# Patient Record
Sex: Male | Born: 1945 | Race: White | Hispanic: No | Marital: Married | Smoking: Former smoker
Health system: Southern US, Community
[De-identification: ages and names within clinical notes are randomized; demographics above are authoritative.]

## PROBLEM LIST (undated history)

## (undated) DIAGNOSIS — L570 Actinic keratosis: Secondary | ICD-10-CM

## (undated) DIAGNOSIS — I10 Essential (primary) hypertension: Secondary | ICD-10-CM

## (undated) DIAGNOSIS — F419 Anxiety disorder, unspecified: Secondary | ICD-10-CM

## (undated) DIAGNOSIS — K5792 Diverticulitis of intestine, part unspecified, without perforation or abscess without bleeding: Secondary | ICD-10-CM

## (undated) DIAGNOSIS — N3001 Acute cystitis with hematuria: Secondary | ICD-10-CM

## (undated) DIAGNOSIS — N400 Enlarged prostate without lower urinary tract symptoms: Secondary | ICD-10-CM

## (undated) DIAGNOSIS — R339 Retention of urine, unspecified: Secondary | ICD-10-CM

## (undated) DIAGNOSIS — E7800 Pure hypercholesterolemia, unspecified: Secondary | ICD-10-CM

## (undated) DIAGNOSIS — M1990 Unspecified osteoarthritis, unspecified site: Secondary | ICD-10-CM

## (undated) HISTORY — DX: Pure hypercholesterolemia, unspecified: E78.00

## (undated) HISTORY — DX: Acute cystitis with hematuria: N30.01

## (undated) HISTORY — DX: Essential (primary) hypertension: I10

## (undated) HISTORY — DX: Benign prostatic hyperplasia without lower urinary tract symptoms: N40.0

## (undated) HISTORY — DX: Anxiety disorder, unspecified: F41.9

## (undated) HISTORY — DX: Actinic keratosis: L57.0

## (undated) HISTORY — DX: Retention of urine, unspecified: R33.9

---
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
## 1994-12-28 HISTORY — PX: RHINOPLASTY: SUR1284

## 2004-10-29 ENCOUNTER — Ambulatory Visit: Payer: Self-pay | Admitting: Unknown Physician Specialty

## 2007-03-14 ENCOUNTER — Ambulatory Visit: Payer: Self-pay | Admitting: Unknown Physician Specialty

## 2010-05-20 ENCOUNTER — Ambulatory Visit: Payer: Self-pay | Admitting: Unknown Physician Specialty

## 2010-12-28 DIAGNOSIS — K5792 Diverticulitis of intestine, part unspecified, without perforation or abscess without bleeding: Secondary | ICD-10-CM

## 2010-12-28 HISTORY — DX: Diverticulitis of intestine, part unspecified, without perforation or abscess without bleeding: K57.92

## 2011-07-24 ENCOUNTER — Ambulatory Visit: Payer: Self-pay | Admitting: Unknown Physician Specialty

## 2011-07-27 LAB — PATHOLOGY REPORT

## 2014-06-28 DIAGNOSIS — E785 Hyperlipidemia, unspecified: Secondary | ICD-10-CM | POA: Insufficient documentation

## 2014-06-28 DIAGNOSIS — M159 Polyosteoarthritis, unspecified: Secondary | ICD-10-CM | POA: Insufficient documentation

## 2014-06-28 DIAGNOSIS — I10 Essential (primary) hypertension: Secondary | ICD-10-CM | POA: Insufficient documentation

## 2014-12-28 HISTORY — PX: OTHER SURGICAL HISTORY: SHX169

## 2015-01-14 ENCOUNTER — Ambulatory Visit: Payer: Self-pay | Admitting: Surgery

## 2015-01-14 LAB — BASIC METABOLIC PANEL
ANION GAP: 6 — AB (ref 7–16)
BUN: 13 mg/dL (ref 7–18)
CHLORIDE: 102 mmol/L (ref 98–107)
CO2: 31 mmol/L (ref 21–32)
Calcium, Total: 9 mg/dL (ref 8.5–10.1)
Creatinine: 0.8 mg/dL (ref 0.60–1.30)
EGFR (African American): >60
EGFR (Non-African Amer.): >60
Glucose: 82 mg/dL (ref 65–99)
OSMOLALITY: 277 (ref 275–301)
Potassium: 4.2 mmol/L (ref 3.5–5.1)
Sodium: 139 mmol/L (ref 136–145)

## 2015-01-14 LAB — CBC WITH DIFFERENTIAL/PLATELET
Basophil #: 0 10*3/uL (ref 0.0–0.1)
Basophil %: 0.4 %
EOS PCT: 3.1 %
Eosinophil #: 0.1 10*3/uL (ref 0.0–0.7)
HCT: 44.9 % (ref 40.0–52.0)
HGB: 14.9 g/dL (ref 13.0–18.0)
LYMPHS ABS: 1.5 10*3/uL (ref 1.0–3.6)
Lymphocyte %: 35.7 %
MCH: 32.4 pg (ref 26.0–34.0)
MCHC: 33.3 g/dL (ref 32.0–36.0)
MCV: 97 fL (ref 80–100)
Monocyte #: 0.5 x10 3/mm (ref 0.2–1.0)
Monocyte %: 11.2 %
NEUTROS ABS: 2.1 10*3/uL (ref 1.4–6.5)
Neutrophil %: 49.6 %
Platelet: 183 10*3/uL (ref 150–440)
RBC: 4.61 10*6/uL (ref 4.40–5.90)
RDW: 13 % (ref 11.5–14.5)
WBC: 4.2 10*3/uL (ref 3.8–10.6)

## 2015-01-22 ENCOUNTER — Ambulatory Visit: Payer: Self-pay | Admitting: Surgery

## 2015-02-01 ENCOUNTER — Inpatient Hospital Stay: Payer: Self-pay | Admitting: Surgery

## 2015-02-01 LAB — CBC WITH DIFFERENTIAL/PLATELET
BASOS ABS: 0 10*3/uL (ref 0.0–0.1)
Basophil %: 0 %
EOS ABS: 0 10*3/uL (ref 0.0–0.7)
Eosinophil %: 0.1 %
HCT: 41 % (ref 40.0–52.0)
HGB: 14 g/dL (ref 13.0–18.0)
LYMPHS PCT: 8.6 %
Lymphocyte #: 1 10*3/uL (ref 1.0–3.6)
MCH: 32 pg (ref 26.0–34.0)
MCHC: 34.1 g/dL (ref 32.0–36.0)
MCV: 94 fL (ref 80–100)
MONOS PCT: 12 %
Monocyte #: 1.4 x10 3/mm — ABNORMAL HIGH (ref 0.2–1.0)
NEUTROS ABS: 9.4 10*3/uL — AB (ref 1.4–6.5)
NEUTROS PCT: 79.3 %
Platelet: 216 10*3/uL (ref 150–440)
RBC: 4.38 10*6/uL — ABNORMAL LOW (ref 4.40–5.90)
RDW: 12.9 % (ref 11.5–14.5)
WBC: 11.8 10*3/uL — AB (ref 3.8–10.6)

## 2015-02-01 LAB — BASIC METABOLIC PANEL
Anion Gap: 13 (ref 7–16)
BUN: 23 mg/dL — ABNORMAL HIGH (ref 7–18)
Calcium, Total: 9 mg/dL (ref 8.5–10.1)
Chloride: 88 mmol/L — ABNORMAL LOW (ref 98–107)
Co2: 25 mmol/L (ref 21–32)
Creatinine: 1.79 mg/dL — ABNORMAL HIGH (ref 0.60–1.30)
EGFR (Non-African Amer.): 40 — ABNORMAL LOW
GFR CALC AF AMER: 49 — AB
GLUCOSE: 100 mg/dL — AB (ref 65–99)
Osmolality: 257 (ref 275–301)
POTASSIUM: 3.6 mmol/L (ref 3.5–5.1)
Sodium: 126 mmol/L — ABNORMAL LOW (ref 136–145)

## 2015-02-02 LAB — CBC WITH DIFFERENTIAL/PLATELET
BASOS ABS: 0 10*3/uL (ref 0.0–0.1)
Basophil %: 0.2 %
EOS PCT: 1.1 %
Eosinophil #: 0.1 10*3/uL (ref 0.0–0.7)
HCT: 35.3 % — AB (ref 40.0–52.0)
HGB: 12 g/dL — ABNORMAL LOW (ref 13.0–18.0)
LYMPHS PCT: 25.9 %
Lymphocyte #: 1.4 10*3/uL (ref 1.0–3.6)
MCH: 31.7 pg (ref 26.0–34.0)
MCHC: 34 g/dL (ref 32.0–36.0)
MCV: 93 fL (ref 80–100)
MONOS PCT: 16.7 %
Monocyte #: 0.9 x10 3/mm (ref 0.2–1.0)
Neutrophil #: 3.1 10*3/uL (ref 1.4–6.5)
Neutrophil %: 56.1 %
PLATELETS: 173 10*3/uL (ref 150–440)
RBC: 3.78 10*6/uL — ABNORMAL LOW (ref 4.40–5.90)
RDW: 12.6 % (ref 11.5–14.5)
WBC: 5.6 10*3/uL (ref 3.8–10.6)

## 2015-02-02 LAB — BASIC METABOLIC PANEL
Anion Gap: 7 (ref 7–16)
BUN: 13 mg/dL (ref 7–18)
CHLORIDE: 100 mmol/L (ref 98–107)
Calcium, Total: 8.8 mg/dL (ref 8.5–10.1)
Co2: 31 mmol/L (ref 21–32)
Creatinine: 1 mg/dL (ref 0.60–1.30)
EGFR (African American): >60
EGFR (Non-African Amer.): >60
Glucose: 83 mg/dL (ref 65–99)
OSMOLALITY: 275 (ref 275–301)
POTASSIUM: 3.1 mmol/L — AB (ref 3.5–5.1)
Sodium: 138 mmol/L (ref 136–145)

## 2015-02-06 ENCOUNTER — Emergency Department: Payer: Self-pay | Admitting: Emergency Medicine

## 2015-04-28 NOTE — H&P (Signed)
PATIENT NAME:  Cole Holt, Cole Holt MR#:  [id number] DATE OF BIRTH:  01-24-1946  DATE OF ADMISSION:  01/22/2015   CHIEF COMPLAINT: Left inguinal hernia.    HISTORY OF PRESENT ILLNESS: This is a 69 year old male with a long history of a left inguinal hernia that is causing him pain and a bulge. The pain is minimal and occasional, located in the left side in the left hemiscrotum.  He is here for elective laparoscopic preperitoneal repair of a left inguinal hernia.   PAST MEDICAL HISTORY: Hypertension, hyperlipidemia.   PAST SURGICAL HISTORY: Appendectomy, nasal septum surgery, and right inguinal hernia repair with recurrence and repair.   MEDICATIONS: Lisinopril and Lipitor.   ALLERGIES: None.   FAMILY HISTORY: Noncontributory.   SOCIAL HISTORY: The patient is a former smoker, occasional drinks alcohol. He is retired.   REVIEW OF SYSTEMS:  A complete system review has been performed and negative with the exception of that mentioned in the history of present illness and documented in the chart at the office.   PHYSICAL EXAMINATION:  GENERAL: Healthy male patient.  HEENT: No scleral icterus.  NECK: No palpable neck nodes.  CHEST: Clear to auscultation.  CARDIAC: Regular rate and rhythm.  ABDOMEN: Soft and nontender. Scars are noted.  EXTREMITIES: Without edema.  NEUROLOGIC: Grossly intact.  INTEGUMENT: No jaundice.  GENITOURINARY: Demonstrates no recurrence on the right side. On the left side, there is a normal testicle, left inguinal hernia which is small.   LABORATORY VALUES: Within normal limits.   ASSESSMENT AND PLAN: This is a patient with a left inguinal hernia. He is here for elective repair. The rationale for this has been discussed. The options of observation have been reviewed. The risks of bleeding, infection, recurrence, mesh placement, mesh infection, conversion to an open procedure and ischemic orchitis, chronic pain syndrome and neuroma have all been reviewed with him. He  understood and agreed to proceed.      ____________________________ Jerrol Banana Burt Knack, MD rec:DT D: 01/21/2015 13:02:43 ET T: 01/21/2015 13:24:31 ET JOB#: [id number]  cc: Jerrol Banana. Burt Knack, MD, <Dictator> Florene Glen MD ELECTRONICALLY SIGNED 01/21/2015 18:59

## 2015-04-28 NOTE — H&P (Signed)
PATIENT NAME:  Cole Holt, Cole Holt MR#:  [id number] DATE OF BIRTH:  [birth year]  DATE OF ADMISSION:  02/01/2015  CHIEF COMPLAINT: Abdominal pain.   HISTORY OF PRESENT ILLNESS: This is a 69 year old male, who is approximately 10 days post laparoscopic preperitoneal repair of a long-standing left inguinal hernia. He was having pain and a bulge and underwent this laparoscopic procedure performed by me on January 26. He presented to the office today with complaints of considerable constipation with only a very small bowel movement after MiraLax, urinary incontinence, back pain, hemorrhoids with hemorrhoidal bleeding and abdominal pain and distention. He was seen in the office and sent for a CT scan. I have spoken directly with Dr. Nevada Crane of radiology and I have personally reviewed his films that are suggestive of acute urinary retention with hydronephrosis.   The patient is being admitted directly from the radiology suite for hydration and Foley catheter placement and re-examination.   The patient had nausea and a single emesis last night. States he had a fever, but when asked directly, it came to just over 99, and he has never had an episode like this before.   PAST MEDICAL HISTORY: Hypertension and hyperlipidemia.   PAST SURGICAL HISTORY: Appendectomy, nasal septum surgery, right inguinal hernia repair with recurrence and repair and a recent laparoscopic left inguinal hernia with a preperitoneal repair.   MEDICATIONS: Lisinopril and Lipitor.   ALLERGIES: NONE.   FAMILY HISTORY: No history of colon cancer.   SOCIAL HISTORY: The patient used to smoke. Occasionally drinks alcohol. He is retired.   REVIEW OF SYSTEMS: A complete system review has been performed and negative with the exception of that mentioned in the HPI and there is documentation of such in the office notes, as well.   PHYSICAL EXAMINATION:  GENERAL: Uncomfortable -appearing male patient.  VITAL SIGNS: He is afebrile. His heart rate  was 100.  HEENT: No scleral icterus.  NECK: No palpable neck nodes.  CHEST: Clear to auscultation.  CARDIAC: Regular rate and rhythm.  ABDOMEN: Distended and tympanitic. He has well-healing scars without erythema or drainage. He is minimally diffusely tender without peritoneal signs. No sign of recurrent hernia.  EXTREMITIES: Without edema. Calves are nontender.  NEUROLOGIC: Grossly intact.  INTEGUMENT: No jaundice.   LABORATORY, DIAGNOSTIC AND RADIOLOGICAL DATA: CT scan is personally reviewed and discussed with Dr. Nevada Crane with the above findings. There is a tiny air bubble near the xiphoid, not explainable, except that he is 10 days postoperative. Dr. Nevada Crane noted no abnormalities with the hernia repair.   White blood cell count was 11, BUN and creatinine of 28 and of 1.7 with a depressed sodium of 126.   ASSESSMENT AND PLAN: This is a patient with acute urinary retention. I have discussed with the patient by telephone in the radiology suite, the need for admission and hydration, as well as  Foley catheter placement. I will discuss this patient with Dr. Rexene Edison, who met the patient in the office earlier today in my presence.   ____________________________ Jerrol Banana. Burt Knack, MD rec:ap D: 02/01/2015 13:32:44 ET T: 02/01/2015 14:06:33 ET JOB#: [id number]  cc: Jerrol Banana. Burt Knack, MD, <Dictator> Florene Glen MD ELECTRONICALLY SIGNED 02/04/2015 23:34

## 2015-04-28 NOTE — Op Note (Signed)
PATIENT NAME:  Cole Holt, Cole Holt MR#:  [id number] DATE OF BIRTH:  Aug 21, 1946  DATE OF PROCEDURE:  01/22/2015  PREOPERATIVE DIAGNOSIS: Symptomatic left inguinal hernia.   POSTOPERATIVE DIAGNOSIS: Symptomatic left inguinal hernia.  PROCEDURE: Laparoscopic preperitoneal repair of a left inguinal hernia.   SURGEON: Richard E. Burt Knack, M.D.   ANESTHESIA: General with endotracheal tube.   INDICATIONS: This is a patient with a painful bulge in the left groin. He has had a prior right inguinal hernia repaired and then a recurrence repaired as well. Preoperatively, we discussed rationale for surgery, the options of observation, risk of bleeding, infection, recurrence, ischemic orchitis, chronic pain syndrome, and neuroma. This was all reviewed for him in the preop holding area in the presence of his family. He understood and agreed to proceed.   FINDINGS: Long-standing large indirect sac with a weak floor. Also of note, his mesh was identified across the midline and was not affixed from his recurrent right inguinal hernia at the midline. Also of note, the patient was unclear as to how he had the previous recurrence repaired, but after he was shaved, it became evident that he had a laparoscopic approach, as he had scars of the umbilicus and infraumbilical area as well, and this was confirmed by  identification of the mesh in the preperitoneal space.   DESCRIPTION OF PROCEDURE: The patient was induced to general anesthesia. A Foley catheter was placed. He was given IV antibiotics. VTE prophylaxis was in place. He was then prepped and draped in a sterile fashion. Marcaine was infiltrated in skin and subcutaneous tissues. Around the periumbilical area, incision was made, dissection down to the left rectus fascia was performed. An incision was made in the fascia and the muscle retracted laterally. The [place] surgical dissecting balloon was placed, followed by the [place] surgical structural balloon, and the preperitoneal  space was insufflated; and under direct vision, 2 midline 5 mm ports were placed. Cooper's ligament was delineated. The mesh from the recurrence repair on the right was identified across the midline, but was not affixed to the Cooper's ligament at that area, and I saw no reason to dissect it out or to affix it.   The cord was skeletonized of its large indirect sac, which was retracted cephalad after the lateral extended dissection was determined, the nerve was identified and kept in view at all times.   In the preperitoneal space was placed a low a large size (Dictation Anomaly)<<Atrium TEXT>>, which had been trimmed to 4 x 6 and laterally scissored. It was placed into the preperitoneal space, held in with the [place] surgical tacker, avoiding the area of the nerve. The preperitoneal space was then desufflated. Under direct vision, all ports were removed and 0 Vicryl was utilized to close the fascia, 4-0 subcuticular Monocryl was used on all skin edges. Steri-Strips, Mastisol, and sterile dressings were placed. The patient tolerated the procedure well. There were no complications. He was taken to the recovery room in stable condition to be discharged in care of his family. He will follow up in 10 days.     ____________________________ Jerrol Banana Burt Knack, MD rec:mw D: 01/22/2015 08:10:13 ET T: 01/22/2015 12:40:30 ET JOB#: [id number]  cc: Jerrol Banana. Burt Knack, MD, <Dictator> Florene Glen MD ELECTRONICALLY SIGNED 01/22/2015 18:32

## 2015-06-24 ENCOUNTER — Encounter: Payer: Self-pay | Admitting: *Deleted

## 2015-06-27 ENCOUNTER — Encounter: Payer: Self-pay | Admitting: Urology

## 2015-06-27 ENCOUNTER — Ambulatory Visit (INDEPENDENT_AMBULATORY_CARE_PROVIDER_SITE_OTHER): Payer: Medicare Other | Admitting: Urology

## 2015-06-27 VITALS — BP 117/77 | HR 76 | Resp 18 | Ht 68.0 in | Wt 164.3 lb

## 2015-06-27 DIAGNOSIS — Z87448 Personal history of other diseases of urinary system: Secondary | ICD-10-CM | POA: Diagnosis not present

## 2015-06-27 DIAGNOSIS — N400 Enlarged prostate without lower urinary tract symptoms: Secondary | ICD-10-CM | POA: Diagnosis not present

## 2015-06-27 DIAGNOSIS — N401 Enlarged prostate with lower urinary tract symptoms: Secondary | ICD-10-CM | POA: Diagnosis not present

## 2015-06-27 DIAGNOSIS — N138 Other obstructive and reflux uropathy: Secondary | ICD-10-CM

## 2015-06-27 DIAGNOSIS — Z87898 Personal history of other specified conditions: Secondary | ICD-10-CM

## 2015-06-27 LAB — BLADDER SCAN AMB NON-IMAGING

## 2015-06-27 MED ORDER — FINASTERIDE 5 MG PO TABS: 5.0000 mg | ORAL_TABLET | Freq: Every day | ORAL | Status: DC

## 2015-06-27 MED ORDER — TAMSULOSIN HCL 0.4 MG PO CAPS: 0.4000 mg | ORAL_CAPSULE | Freq: Every day | ORAL | Status: DC

## 2015-06-27 NOTE — Progress Notes (Signed)
06/27/2015 10:59 AM   Cole Holt Samuella Cota 18-Jan-1946 [id number]  Referring provider: No referring provider defined for this encounter.  Chief Complaint  Patient presents with  . Benign Prostatic Hypertrophy  . Follow-up    HPI: Mr. Brathwaite is a 69 year old white male who had a h/o post urinary retention.  He was placed on tamsulosin and finasteride.  His IPSS score today was 8/1, which is a moderate LUTS.  His previous IPSS was 9/2 on 03/27/2015.  His PVR is 50 mL.  He is very pleased with his urinary symptoms at this time.  He is no longer having to cath himself.    He is not having fevers, chills, nausea, vomiting, suprapubic pain or gross hematuria.  His previous PSA was 2.1 ng/mL on 03/27/2015.      IPSS      06/27/15 1000       International Prostate Symptom Score   How often have you had the sensation of not emptying your bladder? Less than 1 in 5     How often have you had to urinate less than every two hours? Less than half the time     How often have you found you stopped and started again several times when you urinated? Less than 1 in 5 times     How often have you found it difficult to postpone urination? Less than 1 in 5 times     How often have you had a weak urinary stream? Not at All     How often have you had to strain to start urination? Not at All     How many times did you typically get up at night to urinate? 3 Times     Total IPSS Score 8     Quality of Life due to urinary symptoms   If you were to spend the rest of your life with your urinary condition just the way it is now how would you feel about that? Pleased        Score:  1-7 Mild 8-19 Moderate 20-35 Severe    PMH: Past Medical History  Diagnosis Date  . Hypercholesterolemia   . Hypertension   . Anxiety   . BPH (benign prostatic hyperplasia)   . Urine retention   . Acute cystitis with hematuria     Surgical History: Past Surgical History  Procedure Laterality Date  .  Ingiunal hernia repair  2016    1995, 1989  . Rhinoplasty  1996  . Appendectomy  1960    Home Medications:    Medication List       This list is accurate as of: 06/27/15 10:59 AM.  Always use your most recent med list.               atorvastatin 10 MG tablet  Commonly known as:  LIPITOR  Take 10 mg by mouth daily.     finasteride 5 MG tablet  Commonly known as:  PROSCAR  Take 5 mg by mouth daily.     lisinopril 10 MG tablet  Commonly known as:  PRINIVIL,ZESTRIL  Take 10 mg by mouth daily.     MULTIVITAMIN ADULT PO  Take by mouth daily.     tamsulosin 0.4 MG Caps capsule  Commonly known as:  FLOMAX  Take 0.4 mg by mouth daily.        Allergies: No Known Allergies  Family History: Family History  Problem Relation Age of Onset  . Cancer Mother  Social History:  reports that he has quit smoking. He does not have any smokeless tobacco history on file. He reports that he drinks alcohol. His drug history is not on file.  ROS: Urological Symptom Review  Patient is experiencing the following symptoms: Frequent urination Hard to postpone urination Get up at night to urinate Stream starts and stops   Review of Systems  Gastrointestinal (upper)  : Negative for upper GI symptoms  Gastrointestinal (lower) : Negative for lower GI symptoms  Constitutional : Negative for symptoms  Skin: Negative for skin symptoms  Eyes: Negative for eye symptoms  Ear/Nose/Throat : Negative for Ear/Nose/Throat symptoms  Hematologic/Lymphatic: Negative for Hematologic/Lymphatic symptoms  Cardiovascular : Negative for cardiovascular symptoms  Respiratory : Negative for respiratory symptoms  Endocrine: Negative for endocrine symptoms  Musculoskeletal: Negative for musculoskeletal symptoms  Neurological: Negative for neurological symptoms  Psychologic: Negative for psychiatric symptoms   Physical Exam: BP 117/77 mmHg  Pulse 76  Resp 18  Ht 5\' 8"   (1.727 m)  Wt 164 lb 4.8 oz (74.526 kg)  BMI 24.99 kg/m2   Laboratory Data: Results for orders placed or performed in visit on 06/27/15  PSA  Result Value Ref Range   Prostate Specific Ag, Serum 1.2 0.0 - 4.0 ng/mL  BLADDER SCAN AMB NON-IMAGING  Result Value Ref Range   Scan Result 48ml     Lab Results  Component Value Date   WBC 5.6 02/02/2015   HGB 12.0* 02/02/2015   HCT 35.3* 02/02/2015   MCV 93 02/02/2015   PLT 173 02/02/2015    Lab Results  Component Value Date   CREATININE 1.00 02/02/2015    No results found for: PSA  No results found for: TESTOSTERONE  No results found for: HGBA1C  Urinalysis No results found for: COLORURINE, APPEARANCEUR, LABSPEC, PHURINE, GLUCOSEU, HGBUR, BILIRUBINUR, KETONESUR, PROTEINUR, UROBILINOGEN, NITRITE, LEUKOCYTESUR  Pertinent Imaging:   Assessment & Plan:    1. BPH (benign prostatic hyperplasia) with LUTS:   Patient will continue his tamsulosin and finasteride.  PSA is drawn today.  He will follow up in one year for IPSS score, DRE and PSA.    - BLADDER SCAN AMB NON-IMAGING - PSA  2. History of urinary retention:   Patient is currently voiding well on his own.  He is competent in CIC, so if he should develop retention, he will be able to relieve himself at home.  He will contact the office if any retention episodes should occur.     No Follow-up on file.  Zara Council, [facility] [address], [place], [place] [address] [phone]

## 2015-06-28 LAB — PSA: Prostate Specific Ag, Serum: 1.2 ng/mL (ref 0.0–4.0)

## 2015-06-30 DIAGNOSIS — N401 Enlarged prostate with lower urinary tract symptoms: Principal | ICD-10-CM

## 2015-06-30 DIAGNOSIS — Z87898 Personal history of other specified conditions: Secondary | ICD-10-CM | POA: Insufficient documentation

## 2015-06-30 DIAGNOSIS — N138 Other obstructive and reflux uropathy: Secondary | ICD-10-CM | POA: Insufficient documentation

## 2015-07-02 ENCOUNTER — Telehealth: Payer: Self-pay

## 2015-07-02 NOTE — Telephone Encounter (Signed)
-----   Message from Nori Riis, PA-C sent at 06/28/2015  1:02 PM EDT ----- Patient's is good.  We will see him in one year.

## 2015-07-02 NOTE — Telephone Encounter (Signed)
Spoke with pt in reference to lab results. Pt was transferred to the front to make f/u appt. Cw,lpn

## 2016-04-01 ENCOUNTER — Other Ambulatory Visit: Payer: Self-pay | Admitting: Urology

## 2016-06-13 ENCOUNTER — Other Ambulatory Visit: Payer: Self-pay | Admitting: Urology

## 2016-07-01 ENCOUNTER — Ambulatory Visit (INDEPENDENT_AMBULATORY_CARE_PROVIDER_SITE_OTHER): Payer: Medicare Other | Admitting: Urology

## 2016-07-01 ENCOUNTER — Encounter: Payer: Self-pay | Admitting: Urology

## 2016-07-01 VITALS — BP 131/80 | HR 76 | Ht 68.0 in | Wt 168.6 lb

## 2016-07-01 DIAGNOSIS — N528 Other male erectile dysfunction: Secondary | ICD-10-CM

## 2016-07-01 DIAGNOSIS — Z87448 Personal history of other diseases of urinary system: Secondary | ICD-10-CM | POA: Diagnosis not present

## 2016-07-01 DIAGNOSIS — Z87898 Personal history of other specified conditions: Secondary | ICD-10-CM

## 2016-07-01 DIAGNOSIS — N529 Male erectile dysfunction, unspecified: Secondary | ICD-10-CM

## 2016-07-01 DIAGNOSIS — N138 Other obstructive and reflux uropathy: Secondary | ICD-10-CM

## 2016-07-01 DIAGNOSIS — N401 Enlarged prostate with lower urinary tract symptoms: Secondary | ICD-10-CM | POA: Diagnosis not present

## 2016-07-01 NOTE — Progress Notes (Signed)
10:52 AM   Ananda Giusto November 10, 1946 [id number]  Referring provider: Sofie Hartigan, MD [place], [place] [address]  No chief complaint on file.   HPI: Mr. Laduca is a 70 year old WM who is a history of post procedure urinary retention and BPH with LUTS who presents today for a 1 year follow-up.   Urinary retention Patient had a h/o post urinary retention.  He was placed on tamsulosin and finasteride.   He is no longer having to cath himself.    BPH WITH LUTS His IPSS score today is 7, which is mild lower urinary tract symptomatology. He is delighted with his quality life due to his urinary symptoms.  He denies any dysuria, hematuria or suprapubic pain.  He currently taking tamsulosin 0.4 mg and finasteride 5 mg daily.   He also denies any recent fevers, chills, nausea or vomiting.  He does not have a family history of PCa.      IPSS      07/01/16 1000       International Prostate Symptom Score   How often have you had the sensation of not emptying your bladder? Less than 1 in 5     How often have you had to urinate less than every two hours? About half the time     How often have you found you stopped and started again several times when you urinated? Less than 1 in 5 times     How often have you found it difficult to postpone urination? Less than 1 in 5 times     How often have you had a weak urinary stream? Less than 1 in 5 times     How often have you had to strain to start urination? Not at All     How many times did you typically get up at night to urinate? 3 Times     Total IPSS Score 10     Quality of Life due to urinary symptoms   If you were to spend the rest of your life with your urinary condition just the way it is now how would you feel about that? Delighted        Score:  1-7 Mild 8-19 Moderate 20-35 Severe  Patient is also experiencing a decrease in the firmness of his erections over the  last year.  He states he is not longer having nocturnal tumescence.   There is no curvature or pain with his erections.  He has not tried any therapy for this condition.    PMH: Past Medical History  Diagnosis Date  . Hypercholesterolemia   . Hypertension   . Anxiety   . BPH (benign prostatic hyperplasia)   . Urine retention   . Acute cystitis with hematuria     Surgical History: Past Surgical History  Procedure Laterality Date  . Ingiunal hernia repair  2016    1995, 1989  . Rhinoplasty  1996  . Appendectomy  1960    Home Medications:    Medication List       This list is accurate as of: 07/01/16 10:52 AM.  Always use your most recent med list.               atorvastatin 10 MG tablet  Commonly known as:  LIPITOR  Take 10 mg by mouth daily.     finasteride 5 MG tablet  Commonly known as:  PROSCAR  Take  1 tablet by mouth  daily     lisinopril-hydrochlorothiazide 10-12.5 MG tablet  Commonly known as:  PRINZIDE,ZESTORETIC     MULTIVITAMIN ADULT PO  Take by mouth daily.     tamsulosin 0.4 MG Caps capsule  Commonly known as:  FLOMAX  Take 1 capsule by mouth  daily        Allergies: No Known Allergies  Family History: Family History  Problem Relation Age of Onset  . Cancer Mother     Social History:  reports that he has quit smoking. He does not have any smokeless tobacco history on file. He reports that he drinks alcohol. His drug history is not on file.  ROS: Urological Symptom Review  Patient is experiencing the following symptoms: Frequent urination Hard to postpone urination Get up at night to urinate Stream starts and stops   Review of Systems  Gastrointestinal (upper)  : Negative for upper GI symptoms  Gastrointestinal (lower) : Negative for lower GI symptoms  Constitutional : Negative for symptoms  Skin: Negative for skin symptoms  Eyes: Negative for eye symptoms  Ear/Nose/Throat : Negative for Ear/Nose/Throat  symptoms  Hematologic/Lymphatic: Negative for Hematologic/Lymphatic symptoms  Cardiovascular : Negative for cardiovascular symptoms  Respiratory : Negative for respiratory symptoms  Endocrine: Negative for endocrine symptoms  Musculoskeletal: Negative for musculoskeletal symptoms  Neurological: Negative for neurological symptoms  Psychologic: Negative for psychiatric symptoms   Physical Exam: BP 131/80 mmHg  Pulse 76  Ht 5\' 8"  (1.727 m)  Wt 168 lb 9.6 oz (76.476 kg)  BMI 25.64 kg/m2  Constitutional: Well nourished. Alert and oriented, No acute distress. HEENT: [place] AT, moist mucus membranes. Trachea midline, no masses. Cardiovascular: No clubbing, cyanosis, or edema. Respiratory: Normal respiratory effort, no increased work of breathing. GI: Abdomen is soft, non tender, non distended, no abdominal masses. Liver and spleen not palpable.  No hernias appreciated.  Stool sample for occult testing is not indicated.   GU: No CVA tenderness.  No bladder fullness or masses.  Patient with circumcised phallus.   Urethral meatus is patent.  No penile discharge. No penile lesions or rashes. Scrotum without lesions, cysts, rashes and/or edema.  Testicles are located scrotally bilaterally. No masses are appreciated in the testicles. Left and right epididymis are normal. Rectal: Patient with  normal sphincter tone. Anus and perineum without scarring or rashes. No rectal masses are appreciated. Prostate is approximately 50 grams, no nodules are appreciated. Seminal vesicles are normal. Skin: No rashes, bruises or suspicious lesions. Lymph: No cervical or inguinal adenopathy. Neurologic: Grossly intact, no focal deficits, moving all 4 extremities. Psychiatric: Normal mood and affect.   Laboratory Data: Results for orders placed or performed in visit on 06/27/15  PSA  Result Value Ref Range   Prostate Specific Ag, Serum 1.2 0.0 - 4.0 ng/mL  BLADDER SCAN AMB NON-IMAGING  Result Value  Ref Range   Scan Result 83ml    Lab Results  Component Value Date   WBC 5.6 02/02/2015   HGB 12.0* 02/02/2015   HCT 35.3* 02/02/2015   MCV 93 02/02/2015   PLT 173 02/02/2015    Lab Results  Component Value Date   CREATININE 1.00 02/02/2015     Assessment & Plan:    1. BPH with LUTS  - IPSS score is 7/0  - Continue tamsulosin 0.4 mg daily and finasteride 5 mg daily  - PSA  - RTC in 12 months for IPSS, PSA and exam   2. History of urinary retention:  Patient is currently voiding well on his own.  He is competent in CIC, so if he should develop retention, he will be able to relieve himself at home.  He will contact the office if any retention episodes should occur.    3. Erectile dysfunction:   SHIM score is 7/1.   I explained to the patient that in order to achieve an erection it takes good functioning of the nervous system (parasympathetic, sympathetic, sensory and motor), good blood flow into the erectile tissue of the penis and a desire to have sex.   I stated that conditions like diabetes, hypertension, coronary artery disease, peripheral vascular disease, smoking, alcohol consumption, age and BPH can diminish the ability to have an erection.   I suggested with start with a trying a PDE5 inhibitor, as he is naive to this therapy and has no cardiac risk factors and is not on nitrates.  I have given him Viagra 100 mg, # 3 samples.  I have instructed him to take the medication two hours prior to intercourse on an empty stomach.  He is warned not to take the Viagra with medications that contain nitrates.  I also advised him of the side effects, such as: headache, flushing, dyspepsia, abnormal vision, nasal congestion, back pain, myalgia, nausea, dizziness, and rash.  He will call if he desires a prescription.  I stated that we would prescribe the sildenafil 20 mg tablets, since his insurance will not cover PDE5-inhibitors.    Return in about 1 year (around 07/01/2017) for IPSS, SHIM, PSA  and exam.  Zara Council, [facility]  [place] [address], [place], [place] [address] [phone]

## 2016-07-01 NOTE — Patient Instructions (Addendum)
Take two hours before intercourse on and empty stomach.    Sildenafil tablets (Viagra) What is this medicine? SILDENAFIL (sil DEN a fil) is used to treat erection problems in men. This medicine may be used for other purposes; ask your health care provider or pharmacist if you have questions. What should I tell my health care provider before I take this medicine? They need to know if you have any of these conditions: -bleeding disorders -eye or vision problems, including a rare inherited eye disease called retinitis pigmentosa -anatomical deformation of the penis, Peyronie's disease, or history of priapism (painful and prolonged erection) -heart disease, angina, a history of heart attack, irregular heart beats, or other heart problems -high or low blood pressure -history of blood diseases, like sickle cell anemia or leukemia -history of stomach bleeding -kidney disease -liver disease -stroke -an unusual or allergic reaction to sildenafil, other medicines, foods, dyes, or preservatives -pregnant or trying to get pregnant -breast-feeding How should I use this medicine? Take this medicine by mouth with a glass of water. Follow the directions on the prescription label. The dose is usually taken 1 hour before sexual activity. You should not take the dose more than once per day. Do not take your medicine more often than directed. Talk to your pediatrician regarding the use of this medicine in children. This medicine is not used in children for this condition. Overdosage: If you think you have taken too much of this medicine contact a poison control center or emergency room at once. NOTE: This medicine is only for you. Do not share this medicine with others. What if I miss a dose? This does not apply. Do not take double or extra doses. What may interact with this medicine? Do not take this medicine with any of the following medications: -cisapride -methscopolamine nitrate -nitrates like amyl  nitrite, isosorbide dinitrate, isosorbide mononitrate, nitroglycerin -nitroprusside -other medicines for erectile dysfunction like avanafil, tadalafil, vardenafil -riociguat -other sildenafil products (Revatio) This medicine may also interact with the following medications: -certain drugs for high blood pressure -certain drugs for the treatment of HIV infection or AIDS -certain drugs used for fungal or yeast infections, like fluconazole, itraconazole, ketoconazole, and voriconazole -cimetidine -erythromycin -rifampin This list may not describe all possible interactions. Give your health care provider a list of all the medicines, herbs, non-prescription drugs, or dietary supplements you use. Also tell them if you smoke, drink alcohol, or use illegal drugs. Some items may interact with your medicine. What should I watch for while using this medicine? If you notice any changes in your vision while taking this drug, call your doctor or health care professional as soon as possible. Stop using this medicine and call your health care provider right away if you have a loss of sight in one or both eyes. Contact your doctor or health care professional right away if you have an erection that lasts longer than 4 hours or if it becomes painful. This may be a sign of a serious problem and must be treated right away to prevent permanent damage. If you experience symptoms of nausea, dizziness, chest pain or arm pain upon initiation of sexual activity after taking this medicine, you should refrain from further activity and call your doctor or health care professional as soon as possible. Do not drink alcohol to excess (examples, 5 glasses of wine or 5 shots of whiskey) when taking this medicine. When taken in excess, alcohol can increase your chances of getting a headache or getting dizzy,  increasing your heart rate or lowering your blood pressure. Using this medicine does not protect you or your partner against  HIV infection (the virus that causes AIDS) or other sexually transmitted diseases. What side effects may I notice from receiving this medicine? Side effects that you should report to your doctor or health care professional as soon as possible: -allergic reactions like skin rash, itching or hives, swelling of the face, lips, or tongue -breathing problems -changes in hearing -changes in vision -chest pain -fast, irregular heartbeat -prolonged or painful erection -seizures Side effects that usually do not require medical attention (report to your doctor or health care professional if they continue or are bothersome): -back pain -dizziness -flushing -headache -indigestion -muscle aches -nausea -stuffy or runny nose This list may not describe all possible side effects. Call your doctor for medical advice about side effects. You may report side effects to FDA at 1-800-FDA-1088. Where should I keep my medicine? Keep out of reach of children. Store at room temperature between 15 and 30 degrees C (59 and 86 degrees F). Throw away any unused medicine after the expiration date. NOTE: This sheet is a summary. It may not cover all possible information. If you have questions about this medicine, talk to your doctor, pharmacist, or health care provider.    2016, Elsevier/Gold Standard. (2014-05-04 13:19:04) Sildenafil tablets (Viagra) What is this medicine? SILDENAFIL (sil DEN a fil) is used to treat erection problems in men. This medicine may be used for other purposes; ask your health care provider or pharmacist if you have questions. What should I tell my health care provider before I take this medicine? They need to know if you have any of these conditions: -bleeding disorders -eye or vision problems, including a rare inherited eye disease called retinitis pigmentosa -anatomical deformation of the penis, Peyronie's disease, or history of priapism (painful and prolonged erection) -heart  disease, angina, a history of heart attack, irregular heart beats, or other heart problems -high or low blood pressure -history of blood diseases, like sickle cell anemia or leukemia -history of stomach bleeding -kidney disease -liver disease -stroke -an unusual or allergic reaction to sildenafil, other medicines, foods, dyes, or preservatives -pregnant or trying to get pregnant -breast-feeding How should I use this medicine? Take this medicine by mouth with a glass of water. Follow the directions on the prescription label. The dose is usually taken 1 hour before sexual activity. You should not take the dose more than once per day. Do not take your medicine more often than directed. Talk to your pediatrician regarding the use of this medicine in children. This medicine is not used in children for this condition. Overdosage: If you think you have taken too much of this medicine contact a poison control center or emergency room at once. NOTE: This medicine is only for you. Do not share this medicine with others. What if I miss a dose? This does not apply. Do not take double or extra doses. What may interact with this medicine? Do not take this medicine with any of the following medications: -cisapride -methscopolamine nitrate -nitrates like amyl nitrite, isosorbide dinitrate, isosorbide mononitrate, nitroglycerin -nitroprusside -other medicines for erectile dysfunction like avanafil, tadalafil, vardenafil -riociguat -other sildenafil products (Revatio) This medicine may also interact with the following medications: -certain drugs for high blood pressure -certain drugs for the treatment of HIV infection or AIDS -certain drugs used for fungal or yeast infections, like fluconazole, itraconazole, ketoconazole, and voriconazole -cimetidine -erythromycin -rifampin This list may not  describe all possible interactions. Give your health care provider a list of all the medicines, herbs,  non-prescription drugs, or dietary supplements you use. Also tell them if you smoke, drink alcohol, or use illegal drugs. Some items may interact with your medicine. What should I watch for while using this medicine? If you notice any changes in your vision while taking this drug, call your doctor or health care professional as soon as possible. Stop using this medicine and call your health care provider right away if you have a loss of sight in one or both eyes. Contact your doctor or health care professional right away if you have an erection that lasts longer than 4 hours or if it becomes painful. This may be a sign of a serious problem and must be treated right away to prevent permanent damage. If you experience symptoms of nausea, dizziness, chest pain or arm pain upon initiation of sexual activity after taking this medicine, you should refrain from further activity and call your doctor or health care professional as soon as possible. Do not drink alcohol to excess (examples, 5 glasses of wine or 5 shots of whiskey) when taking this medicine. When taken in excess, alcohol can increase your chances of getting a headache or getting dizzy, increasing your heart rate or lowering your blood pressure. Using this medicine does not protect you or your partner against HIV infection (the virus that causes AIDS) or other sexually transmitted diseases. What side effects may I notice from receiving this medicine? Side effects that you should report to your doctor or health care professional as soon as possible: -allergic reactions like skin rash, itching or hives, swelling of the face, lips, or tongue -breathing problems -changes in hearing -changes in vision -chest pain -fast, irregular heartbeat -prolonged or painful erection -seizures Side effects that usually do not require medical attention (report to your doctor or health care professional if they continue or are bothersome): -back  pain -dizziness -flushing -headache -indigestion -muscle aches -nausea -stuffy or runny nose This list may not describe all possible side effects. Call your doctor for medical advice about side effects. You may report side effects to FDA at 1-800-FDA-1088. Where should I keep my medicine? Keep out of reach of children. Store at room temperature between 15 and 30 degrees C (59 and 86 degrees F). Throw away any unused medicine after the expiration date. NOTE: This sheet is a summary. It may not cover all possible information. If you have questions about this medicine, talk to your doctor, pharmacist, or health care provider.    2016, Elsevier/Gold Standard. (2014-05-04 13:19:04)

## 2016-07-02 ENCOUNTER — Encounter: Payer: Self-pay | Admitting: Urology

## 2016-07-02 DIAGNOSIS — N529 Male erectile dysfunction, unspecified: Secondary | ICD-10-CM

## 2016-07-02 LAB — PSA: Prostate Specific Ag, Serum: 1 ng/mL (ref 0.0–4.0)

## 2016-07-03 MED ORDER — SILDENAFIL CITRATE 20 MG PO TABS: ORAL_TABLET | ORAL | Status: DC

## 2016-07-03 NOTE — Telephone Encounter (Signed)
Medication sent to pt pharmacy 

## 2016-07-06 ENCOUNTER — Telehealth: Payer: Self-pay

## 2016-07-06 NOTE — Telephone Encounter (Signed)
-----   Message from Nori Riis, PA-C sent at 07/02/2016  8:31 AM EDT ----- PSA is stable.  We will see him next year.

## 2016-07-06 NOTE — Telephone Encounter (Signed)
Spoke with pt in reference to PSA results. Pt voiced understanding.  

## 2016-09-12 ENCOUNTER — Other Ambulatory Visit: Payer: Self-pay | Admitting: Urology

## 2016-11-12 ENCOUNTER — Encounter: Payer: Self-pay | Admitting: *Deleted

## 2016-11-13 ENCOUNTER — Ambulatory Visit
Admission: RE | Admit: 2016-11-13 | Discharge: 2016-11-13 | Disposition: A | Discharge status: Home / Self Care | Payer: Medicare Other | Source: Ambulatory Visit | Attending: Unknown Physician Specialty | Admitting: Unknown Physician Specialty

## 2016-11-13 ENCOUNTER — Encounter: Payer: Self-pay | Admitting: Certified Registered Nurse Anesthetist

## 2016-11-13 ENCOUNTER — Encounter
Admission: RE | Disposition: A | Discharge status: Home / Self Care | Payer: Self-pay | Source: Ambulatory Visit | Attending: Unknown Physician Specialty

## 2016-11-13 ENCOUNTER — Ambulatory Visit: Payer: Medicare Other | Admitting: Certified Registered Nurse Anesthetist

## 2016-11-13 DIAGNOSIS — K640 First degree hemorrhoids: Secondary | ICD-10-CM | POA: Diagnosis not present

## 2016-11-13 DIAGNOSIS — M1990 Unspecified osteoarthritis, unspecified site: Secondary | ICD-10-CM | POA: Insufficient documentation

## 2016-11-13 DIAGNOSIS — N400 Enlarged prostate without lower urinary tract symptoms: Secondary | ICD-10-CM | POA: Diagnosis not present

## 2016-11-13 DIAGNOSIS — E7800 Pure hypercholesterolemia, unspecified: Secondary | ICD-10-CM | POA: Diagnosis not present

## 2016-11-13 DIAGNOSIS — Z87891 Personal history of nicotine dependence: Secondary | ICD-10-CM | POA: Diagnosis not present

## 2016-11-13 DIAGNOSIS — F419 Anxiety disorder, unspecified: Secondary | ICD-10-CM | POA: Diagnosis not present

## 2016-11-13 DIAGNOSIS — Z1211 Encounter for screening for malignant neoplasm of colon: Secondary | ICD-10-CM | POA: Diagnosis present

## 2016-11-13 DIAGNOSIS — D123 Benign neoplasm of transverse colon: Secondary | ICD-10-CM | POA: Insufficient documentation

## 2016-11-13 DIAGNOSIS — I10 Essential (primary) hypertension: Secondary | ICD-10-CM | POA: Insufficient documentation

## 2016-11-13 DIAGNOSIS — Z86010 Personal history of colonic polyps: Secondary | ICD-10-CM | POA: Diagnosis not present

## 2016-11-13 DIAGNOSIS — K635 Polyp of colon: Secondary | ICD-10-CM | POA: Diagnosis not present

## 2016-11-13 DIAGNOSIS — D122 Benign neoplasm of ascending colon: Secondary | ICD-10-CM | POA: Insufficient documentation

## 2016-11-13 HISTORY — PX: COLONOSCOPY WITH PROPOFOL: SHX5780

## 2016-11-13 HISTORY — DX: Unspecified osteoarthritis, unspecified site: M19.90

## 2016-11-13 HISTORY — DX: Diverticulitis of intestine, part unspecified, without perforation or abscess without bleeding: K57.92

## 2016-11-13 SURGERY — COLONOSCOPY WITH PROPOFOL
Anesthesia: General

## 2016-11-13 MED ORDER — MIDAZOLAM HCL 2 MG/2ML IJ SOLN
INTRAMUSCULAR | Status: DC | PRN
  Administered 2016-11-13: 1 mg via INTRAVENOUS

## 2016-11-13 MED ORDER — PROPOFOL 10 MG/ML IV BOLUS
INTRAVENOUS | Status: DC | PRN
  Administered 2016-11-13: 20 mg via INTRAVENOUS
  Administered 2016-11-13: 30 mg via INTRAVENOUS
  Administered 2016-11-13: 20 mg via INTRAVENOUS

## 2016-11-13 MED ORDER — LIDOCAINE HCL (CARDIAC) 20 MG/ML IV SOLN
INTRAVENOUS | Status: DC | PRN
  Administered 2016-11-13: 30 mg via INTRAVENOUS

## 2016-11-13 MED ORDER — SODIUM CHLORIDE 0.9 % IV SOLN
INTRAVENOUS | Status: DC
  Administered 2016-11-13: 1000 mL via INTRAVENOUS

## 2016-11-13 MED ORDER — SODIUM CHLORIDE 0.9 % IV SOLN: INTRAVENOUS | Status: DC

## 2016-11-13 MED ORDER — PROPOFOL 500 MG/50ML IV EMUL
INTRAVENOUS | Status: DC | PRN
  Administered 2016-11-13: 140 ug/kg/min via INTRAVENOUS

## 2016-11-13 NOTE — Transfer of Care (Signed)
Immediate Anesthesia Transfer of Care Note  Patient: Cole Holt  Procedure(s) Performed: Procedure(s): COLONOSCOPY WITH PROPOFOL (N/A)  Patient Location: PACU  Anesthesia Type:General  Level of Consciousness: sedated  Airway & Oxygen Therapy: Patient Spontanous Breathing and Patient connected to nasal cannula oxygen  Post-op Assessment: Report given to RN and Post -op Vital signs reviewed and stable  Post vital signs: Reviewed and stable  Last Vitals:  Vitals:   11/13/16 1039 11/13/16 1224  BP: 115/69 (!) 107/54  Pulse: 76 66  Resp: 16 15  Temp: 37 C 36.4 C    Last Pain:  Vitals:   11/13/16 1039  TempSrc: Tympanic         Complications: No apparent anesthesia complications

## 2016-11-13 NOTE — Op Note (Signed)
[facility] Gastroenterology Patient Name: Cole Holt Procedure Date: 11/13/2016 11:38 AM MRN: [id number] Account #: [id number] Date of Birth: 1946/05/10 Admit Type: Outpatient Age: 70 Room: [facility] ENDO ROOM 3 Gender: Male Note Status: Finalized Procedure:            Colonoscopy Indications:          High risk colon cancer surveillance: Personal history                        of colonic polyps Providers:            Manya Silvas, MD Referring MD:         Sofie Hartigan (Referring MD) Medicines:            Propofol per Anesthesia Complications:        No immediate complications. Procedure:            Pre-Anesthesia Assessment:                       - After reviewing the risks and benefits, the patient                        was deemed in satisfactory condition to undergo the                        procedure.                       After obtaining informed consent, the colonoscope was                        passed under direct vision. Throughout the procedure,                        the patient's blood pressure, pulse, and oxygen                        saturations were monitored continuously. The                        Colonoscope was introduced through the anus and                        advanced to the the cecum, identified by appendiceal                        orifice and ileocecal valve. The colonoscopy was                        somewhat difficult due to significant looping and a                        tortuous colon. Successful completion of the procedure                        was aided by pulling back on loops. Findings:      A small polyp was found in the proximal ascending colon. The polyp was       sessile. The polyp was removed with a hot snare. Resection and retrieval       were complete.  A diminutive polyp was found in the ascending colon. The polyp was       sessile. The polyp was removed with a jumbo cold forceps. Resection and      retrieval were complete.      A diminutive polyp was found in the transverse colon. The polyp was       sessile. The polyp was removed with a jumbo cold forceps. Resection and       retrieval were complete.      A diminutive polyp was found in the transverse colon. The polyp was       sessile. The polyp was removed with a jumbo cold forceps. Resection and       retrieval were complete.      A small polyp was found in the descending colon. The polyp was sessile.       The polyp was removed with a hot snare. Resection and retrieval were       complete.      A diminutive polyp was found in the rectum. The polyp was sessile. The       polyp was removed with a jumbo cold forceps. Resection and retrieval       were complete.      Internal hemorrhoids were found during endoscopy. The hemorrhoids were       medium-sized and Grade I (internal hemorrhoids that do not prolapse).      The exam was otherwise without abnormality. Impression:           - One small polyp in the proximal ascending colon,                        removed with a hot snare. Resected and retrieved.                       - One diminutive polyp in the ascending colon, removed                        with a jumbo cold forceps. Resected and retrieved.                       - One diminutive polyp in the transverse colon, removed                        with a jumbo cold forceps. Resected and retrieved.                       - One diminutive polyp in the transverse colon, removed                        with a jumbo cold forceps. Resected and retrieved.                       - One small polyp in the descending colon, removed with                        a hot snare. Resected and retrieved.                       - One diminutive polyp in the rectum, removed with a  jumbo cold forceps. Resected and retrieved.                       - Internal hemorrhoids.                       - The examination was otherwise  normal. Recommendation:       - Await pathology results. Manya Silvas, MD 11/13/2016 12:23:53 PM This report has been signed electronically. Number of Addenda: 0 Note Initiated On: 11/13/2016 11:38 AM Scope Withdrawal Time: 0 hours 24 minutes 43 seconds  Total Procedure Duration: 0 hours 34 minutes 12 seconds       [facility]

## 2016-11-13 NOTE — Anesthesia Preprocedure Evaluation (Signed)
Anesthesia Evaluation  Patient identified by MRN, date of birth, ID band Patient awake    Reviewed: Allergy & Precautions, NPO status , Patient's Chart, lab work & pertinent test results  History of Anesthesia Complications Negative for: history of anesthetic complications  Airway Mallampati: II       Dental   Pulmonary neg pulmonary ROS, former smoker,           Cardiovascular hypertension, Pt. on medications      Neuro/Psych negative neurological ROS     GI/Hepatic negative GI ROS, Neg liver ROS,   Endo/Other  negative endocrine ROS  Renal/GU negative Renal ROS     Musculoskeletal   Abdominal   Peds  Hematology negative hematology ROS (+)   Anesthesia Other Findings   Reproductive/Obstetrics                            Anesthesia Physical Anesthesia Plan  ASA: II  Anesthesia Plan: General   Post-op Pain Management:    Induction: Intravenous  Airway Management Planned: Nasal Cannula  Additional Equipment:   Intra-op Plan:   Post-operative Plan:   Informed Consent: I have reviewed the patients History and Physical, chart, labs and discussed the procedure including the risks, benefits and alternatives for the proposed anesthesia with the patient or authorized representative who has indicated his/her understanding and acceptance.     Plan Discussed with:   Anesthesia Plan Comments:         Anesthesia Quick Evaluation

## 2016-11-13 NOTE — Anesthesia Procedure Notes (Signed)
Date/Time: 11/13/2016 11:40 AM Performed by: Johnna Acosta Pre-anesthesia Checklist: Patient identified, Emergency Drugs available, Suction available, Patient being monitored and Timeout performed Patient Re-evaluated:Patient Re-evaluated prior to inductionOxygen Delivery Method: Nasal cannula

## 2016-11-13 NOTE — H&P (Signed)
   Primary Care Physician:  [facility], MD Primary Gastroenterologist:  Dr. Vira Agar  Pre-Procedure History & Physical: HPI:  Cole Holt is a 70 y.o. male is here for an colonoscopy.   Past Medical History:  Diagnosis Date  . Acute cystitis with hematuria   . Anxiety   . Arthritis   . BPH (benign prostatic hyperplasia)   . Diverticulitis 2012  . Hypercholesterolemia   . Hypertension   . Urine retention     Past Surgical History:  Procedure Laterality Date  . APPENDECTOMY  1960  . ingiunal hernia repair  2016   1995, 1989  . RHINOPLASTY  1996    Prior to Admission medications   Medication Sig Start Date End Date Taking? Authorizing Provider  atorvastatin (LIPITOR) 10 MG tablet Take 10 mg by mouth daily.   Yes Historical Provider, MD  finasteride (PROSCAR) 5 MG tablet Take 1 tablet by mouth  daily 04/01/16  Yes Shannon A McGowan, PA-C  lisinopril-hydrochlorothiazide (PRINZIDE,ZESTORETIC) 10-12.5 MG tablet  04/01/16  Yes Historical Provider, MD  Multiple Vitamins-Minerals (MULTIVITAMIN ADULT PO) Take by mouth daily.   Yes Historical Provider, MD  sildenafil (REVATIO) 20 MG tablet 2-5 tablets prior to intercourse 07/03/16  Yes Shannon A McGowan, PA-C  tamsulosin (FLOMAX) 0.4 MG CAPS capsule Take 1 capsule by mouth  daily 09/12/16  Yes Shannon A McGowan, PA-C    Allergies as of 09/17/2016  . (No Known Allergies)    Family History  Problem Relation Age of Onset  . Cancer Mother     Social History   Social History  . Marital status: Married    Spouse name: N/A  . Number of children: N/A  . Years of education: N/A   Occupational History  . Not on file.   Social History Main Topics  . Smoking status: Former Research scientist (life sciences)  . Smokeless tobacco: Never Used     Comment: quit 40 years ago  . Alcohol use 0.0 oz/week     Comment: occasional  . Drug use: No  . Sexual activity: Not on file   Other Topics Concern  . Not on file   Social History Narrative  . No  narrative on file    Review of Systems: See HPI, otherwise negative ROS  Physical Exam: BP 115/69   Pulse 76   Temp 98.6 F (37 C) (Tympanic)   Resp 16   Ht 5\' 8"  (1.727 m)   Wt 70.8 kg (156 lb)   SpO2 99%   BMI 23.72 kg/m  General:   Alert,  pleasant and cooperative in NAD Head:  Normocephalic and atraumatic. Neck:  Supple; no masses or thyromegaly. Lungs:  Clear throughout to auscultation.    Heart:  Regular rate and rhythm. Abdomen:  Soft, nontender and nondistended. Normal bowel sounds, without guarding, and without rebound.   Neurologic:  Alert and  oriented x4;  grossly normal neurologically.  Impression/Plan: Cole Holt is here for an colonoscopy to be performed for [facility] colon polyps  Risks, benefits, limitations, and alternatives regarding  colonoscopy have been reviewed with the patient.  Questions have been answered.  All parties agreeable.   Gaylyn Cheers, MD  11/13/2016, 11:38 AM

## 2016-11-13 NOTE — Anesthesia Postprocedure Evaluation (Signed)
Anesthesia Post Note  Patient: Cole Holt  Procedure(s) Performed: Procedure(s) (LRB): COLONOSCOPY WITH PROPOFOL (N/A)  Patient location during evaluation: Endoscopy Anesthesia Type: General Level of consciousness: awake and alert Pain management: pain level controlled Vital Signs Assessment: post-procedure vital signs reviewed and stable Respiratory status: spontaneous breathing and respiratory function stable Cardiovascular status: stable Anesthetic complications: no    Last Vitals:  Vitals:   11/13/16 1244 11/13/16 1254  BP: 109/68 116/77  Pulse: (!) 32 (!) 57  Resp: 16 12  Temp:      Last Pain:  Vitals:   11/13/16 1224  TempSrc:   PainSc: Asleep                 Janel Beane K

## 2016-11-16 ENCOUNTER — Encounter: Payer: Self-pay | Admitting: Unknown Physician Specialty

## 2016-11-16 LAB — SURGICAL PATHOLOGY

## 2017-03-03 ENCOUNTER — Other Ambulatory Visit: Payer: Self-pay | Admitting: Urology

## 2017-03-03 DIAGNOSIS — N401 Enlarged prostate with lower urinary tract symptoms: Secondary | ICD-10-CM

## 2017-07-05 ENCOUNTER — Other Ambulatory Visit: Payer: Medicare Other

## 2017-07-05 DIAGNOSIS — N401 Enlarged prostate with lower urinary tract symptoms: Principal | ICD-10-CM

## 2017-07-05 DIAGNOSIS — N138 Other obstructive and reflux uropathy: Secondary | ICD-10-CM

## 2017-07-06 LAB — PSA: Prostate Specific Ag, Serum: 1.6 ng/mL (ref 0.0–4.0)

## 2017-07-11 NOTE — Progress Notes (Signed)
3:18 PM   Cole Holt November 22, 1946 [id number]  Referring provider: Sofie Hartigan, MD [place], [name]  Chief Complaint  Patient presents with  . Benign Prostatic Hypertrophy    1 year follow up    HPI: [name] is a 71 year old WM who is a history of post procedure urinary retention and BPH with LU TS and ED who presents today for a 1 year follow-up.   History of urinary retention Patient had a h/o post urinary retention.  He was placed on tamsulosin and finasteride.   He is no longer having to cath himself.    BPH WITH LUTS His IPSS score today is 7, which is mild lower urinary tract symptomatology. He is pleased with his quality life due to his urinary symptoms.  His previous I PSS score was 7/0.  He denies any dysuria, hematuria or suprapubic pain.  He currently taking tamsulosin 0.4 mg and finasteride 5 mg daily.   He also denies any recent fevers, chills, nausea or vomiting.  He does not have a family history of PCa.      IPSS    Row Name 07/12/17 1500         International Prostate Symptom Score   How often have you had the sensation of not emptying your bladder? Less than half the time     How often have you had to urinate less than every two hours? Less than half the time     How often have you found you stopped and started again several times when you urinated? Not at All     How often have you found it difficult to postpone urination? Less than 1 in 5 times     How often have you had a weak urinary stream? Not at All     How often have you had to strain to start urination? Not at All     How many times did you typically get up at night to urinate? 2 Times     Total IPSS Score 7       Quality of Life due to urinary symptoms   If you were to spend the rest of your life with your urinary condition just the way it is now how would you feel about that? Pleased        Score:  1-7 Mild 8-19 Moderate 20-35 Severe  Erectile  dysfunction His SHIM score is 22, which is no ED.   His previous SHIM score was 7.  He has been having difficulty with erections for a few months.   His major complaint is a lack of firmness.  His libido is preserved.   His risk factors for ED are age, BPH, HTN and HLD.  He denies any painful erections or curvatures with his erections.   He is still having/no longer having spontaneous erections.  He has tried PDE5-inhibitors in the past with good results.       SHIM    Row Name 07/12/17 1508         SHIM: Over the last 6 months:   How do you rate your confidence that you could get and keep an erection? Moderate     When you had erections with sexual stimulation, how often were your erections hard enough for penetration (entering your partner)? Most Times (much more than half the time)     During sexual intercourse, how often were you able to maintain your  erection after you had penetrated (entered) your partner? Almost Always or Always     During sexual intercourse, how difficult was it to maintain your erection to completion of intercourse? Not Difficult     When you attempted sexual intercourse, how often was it satisfactory for you? Almost Always or Always       SHIM Total Score   SHIM 22        Score: 1-7 Severe ED 8-11 Moderate ED 12-16 Mild-Moderate ED 17-21 Mild ED 22-25 No ED   PMH: Past Medical History:  Diagnosis Date  . Acute cystitis with hematuria   . Anxiety   . Arthritis   . BPH (benign prostatic hyperplasia)   . Diverticulitis 2012  . Hypercholesterolemia   . Hypertension   . Urine retention     Surgical History: Past Surgical History:  Procedure Laterality Date  . APPENDECTOMY  1960  . COLONOSCOPY WITH PROPOFOL N/A 11/13/2016   Procedure: COLONOSCOPY WITH PROPOFOL;  Surgeon: Manya Silvas, MD;  Location: [facility] ENDOSCOPY;  Service: Endoscopy;  Laterality: N/A;  . ingiunal hernia repair  2016   1995, 1989  . RHINOPLASTY  1996    Home Medications:    Allergies as of 07/12/2017   No Known Allergies     Medication List       Accurate as of 07/12/17  3:18 PM. Always use your most recent med list.          aspirin 81 MG chewable tablet Chew by mouth.   atorvastatin 10 MG tablet Commonly known as:  LIPITOR Take 10 mg by mouth daily.   finasteride 5 MG tablet Commonly known as:  PROSCAR Take 1 tablet (5 mg total) by mouth daily.   lisinopril-hydrochlorothiazide 10-12.5 MG tablet Commonly known as:  PRINZIDE,ZESTORETIC   MULTI-VITAMINS Tabs Take by mouth.   MULTIVITAMIN ADULT PO Take by mouth daily.   PROCTOZONE-HC 2.5 % rectal cream Generic drug:  hydrocortisone USE TOPICALLY THREE TIMES DAILY FOR 10 DAYS   saccharomyces boulardii 250 MG capsule Commonly known as:  FLORASTOR Take by mouth.   sildenafil 20 MG tablet Commonly known as:  REVATIO 2-5 tablets prior to intercourse   tamsulosin 0.4 MG Caps capsule Commonly known as:  FLOMAX Take 1 capsule (0.4 mg total) by mouth daily.       Allergies: No Known Allergies  Family History: Family History  Problem Relation Age of Onset  . Cancer Mother   . Prostate cancer Neg Hx   . Kidney cancer Neg Hx   . Bladder Cancer Neg Hx     Social History:  reports that he has quit smoking. He has never used smokeless tobacco. He reports that he drinks alcohol. He reports that he does not use drugs.  ROS: UROLOGY Frequent Urination?: No Hard to postpone urination?: No Burning/pain with urination?: No Get up at night to urinate?: Yes Leakage of urine?: No Urine stream starts and stops?: No Trouble starting stream?: No Do you have to strain to urinate?: No Blood in urine?: No Urinary tract infection?: No Sexually transmitted disease?: No Injury to kidneys or bladder?: No Painful intercourse?: No Weak stream?: No Erection problems?: No Penile pain?: No Gastrointestinal Nausea?: No Vomiting?: No Indigestion/heartburn?: No Diarrhea?: No Constipation?:  No Constitutional Fever: No Night sweats?: No Weight loss?: No Fatigue?: No Skin Skin rash/lesions?: No Itching?: No Eyes Blurred vision?: No Double vision?: No Ears/Nose/Throat Sore throat?: No Sinus problems?: No Hematologic/Lymphatic Swollen glands?: No Easy bruising?: No Cardiovascular Leg swelling?:  No Chest pain?: No Respiratory Cough?: No Shortness of breath?: No Endocrine Excessive thirst?: No Musculoskeletal Back pain?: No Joint pain?: No Neurological Headaches?: No Dizziness?: No Psychologic Depression?: No Anxiety?: No     Physical Exam: BP 115/74   Pulse 71   Ht 5\' 8"  (1.727 m)   Wt 163 lb 8 oz (74.2 kg)   BMI 24.86 kg/m   Constitutional: Well nourished. Alert and oriented, No acute distress. HEENT: [place] AT, moist mucus membranes. Trachea midline, no masses. Cardiovascular: No clubbing, cyanosis, or edema. Respiratory: Normal respiratory effort, no increased work of breathing. GI: Abdomen is soft, non tender, non distended, no abdominal masses. Liver and spleen not palpable.  No hernias appreciated.  Stool sample for occult testing is not indicated.   GU: No CVA tenderness.  No bladder fullness or masses.  Patient with circumcised phallus.   Urethral meatus is patent.  No penile discharge. No penile lesions or rashes. Scrotum without lesions, cysts, rashes and/or edema.  Testicles are located scrotally bilaterally. No masses are appreciated in the testicles. Left and right epididymis are normal. Rectal: Patient with  normal sphincter tone. Anus and perineum without scarring or rashes. No rectal masses are appreciated. Prostate is approximately 50 grams, no nodules are appreciated. Seminal vesicles are normal. Skin: No rashes, bruises or suspicious lesions. Lymph: No cervical or inguinal adenopathy. Neurologic: Grossly intact, no focal deficits, moving all 4 extremities. Psychiatric: Normal mood and affect.   Laboratory Data: Results for orders  placed or performed in visit on 07/05/17  PSA  Result Value Ref Range   Prostate Specific Ag, Serum 1.6 0.0 - 4.0 ng/mL   I have reviewed the labs  Assessment & Plan:    1. BPH with LUTS  - IPSS score is 7/1  - Continue tamsulosin 0.4 mg daily and finasteride 5 mg daily; refills given  - RTC in 12 months for IPSS, PSA and exam   2. History of urinary retention:   Patient is currently voiding well on his own.  He is competent in CIC, so if he should develop retention, he will be able to relieve himself at home.  He will contact the office if any retention episodes should occur.    3. Erectile dysfunction:   SHIM score is 22.  RTC in one year for SHIM and exam.    Return in about 1 year (around 07/12/2018) for IPSS, SHIM, PSA and exam.  Zara Council, [facility]  [facility] [address] [place], [place] [address] [phone]

## 2017-07-12 ENCOUNTER — Encounter: Payer: Self-pay | Admitting: Urology

## 2017-07-12 ENCOUNTER — Ambulatory Visit: Payer: Medicare Other | Admitting: Urology

## 2017-07-12 VITALS — BP 115/74 | HR 71 | Ht 68.0 in | Wt 163.5 lb

## 2017-07-12 DIAGNOSIS — Z87898 Personal history of other specified conditions: Secondary | ICD-10-CM | POA: Diagnosis not present

## 2017-07-12 DIAGNOSIS — N401 Enlarged prostate with lower urinary tract symptoms: Secondary | ICD-10-CM

## 2017-07-12 DIAGNOSIS — N529 Male erectile dysfunction, unspecified: Secondary | ICD-10-CM

## 2017-07-12 DIAGNOSIS — N138 Other obstructive and reflux uropathy: Secondary | ICD-10-CM

## 2017-07-12 MED ORDER — TAMSULOSIN HCL 0.4 MG PO CAPS: 0.4000 mg | ORAL_CAPSULE | Freq: Every day | ORAL | 3 refills | Status: DC

## 2017-07-12 MED ORDER — FINASTERIDE 5 MG PO TABS: 5.0000 mg | ORAL_TABLET | Freq: Every day | ORAL | 3 refills | Status: DC

## 2018-05-21 ENCOUNTER — Other Ambulatory Visit: Payer: Self-pay | Admitting: Urology

## 2018-05-21 DIAGNOSIS — N401 Enlarged prostate with lower urinary tract symptoms: Principal | ICD-10-CM

## 2018-05-21 DIAGNOSIS — N138 Other obstructive and reflux uropathy: Secondary | ICD-10-CM

## 2018-07-07 ENCOUNTER — Other Ambulatory Visit: Payer: Self-pay | Admitting: Family Medicine

## 2018-07-07 ENCOUNTER — Other Ambulatory Visit: Payer: Medicare Other

## 2018-07-07 DIAGNOSIS — N138 Other obstructive and reflux uropathy: Secondary | ICD-10-CM

## 2018-07-07 DIAGNOSIS — N401 Enlarged prostate with lower urinary tract symptoms: Principal | ICD-10-CM

## 2018-07-08 LAB — PSA: Prostate Specific Ag, Serum: 0.9 ng/mL (ref 0.0–4.0)

## 2018-07-13 NOTE — Progress Notes (Signed)
3:25 PM   Cole Holt 08-Nov-1946 [id number]  Referring provider: Sofie Hartigan, MD [place], [place] [address]  Chief Complaint  Patient presents with  . Benign Prostatic Hypertrophy  . Urinary Retention    HPI: Mr. Grieco is a 72 year old WM who is a history of post procedure urinary retention and BPH with LU TS and ED who presents today for a 1 year follow-up.   History of urinary retention Patient had a h/o post urinary retention.  He was placed on tamsulosin and finasteride.   He is no longer having to cath himself.   BPH WITH LUTS His IPSS score today is 10, which is moderate lower urinary tract symptomatology. He is pleased with his quality life due to his urinary symptoms.  His previous I PSS score was 7/0.  He denies any dysuria, hematuria or suprapubic pain.  He currently taking tamsulosin 0.4 mg and finasteride 5 mg daily.   He also denies any recent fevers, chills, nausea or vomiting.  He does not have a family history of PCa.  IPSS    Row Name 07/14/18 1400         International Prostate Symptom Score   How often have you had the sensation of not emptying your bladder?  Less than 1 in 5     How often have you had to urinate less than every two hours?  Less than half the time     How often have you found you stopped and started again several times when you urinated?  Less than 1 in 5 times     How often have you found it difficult to postpone urination?  Less than 1 in 5 times     How often have you had a weak urinary stream?  Less than 1 in 5 times     How often have you had to strain to start urination?  Not at All     How many times did you typically get up at night to urinate?  4 Times     Total IPSS Score  10       Quality of Life due to urinary symptoms   If you were to spend the rest of your life with your urinary condition just the way it is now how would you feel about that?  Pleased        Score:  1-7 Mild 8-19  Moderate 20-35 Severe  Erectile dysfunction His SHIM score is 17, which is mild ED.   His previous SHIM score was 22.Marland Kitchen  He has been having difficulty with erections for a few months.   His major complaint is a lack of firmness.  His libido is preserved.   His risk factors for ED are age, BPH, HTN and HLD.  He denies any painful erections or curvatures with his erections.   He is still having/no longer having spontaneous erections.  He has tried PDE5-inhibitors in the past with good results.   SHIM    Row Name 07/14/18 1520         SHIM: Over the last 6 months:   How do you rate your confidence that you could get and keep an erection?  Moderate     When you had erections with sexual stimulation, how often were your erections hard enough for penetration (entering your partner)?  Sometimes (about half the time)     During sexual intercourse, how often were you able to  maintain your erection after you had penetrated (entered) your partner?  Sometimes (about half the time)     During sexual intercourse, how difficult was it to maintain your erection to completion of intercourse?  Difficult     When you attempted sexual intercourse, how often was it satisfactory for you?  Almost Always or Always       SHIM Total Score   SHIM  17        Score: 1-7 Severe ED 8-11 Moderate ED 12-16 Mild-Moderate ED 17-21 Mild ED 22-25 No ED  PMH: Past Medical History:  Diagnosis Date  . Acute cystitis with hematuria   . Anxiety   . Arthritis   . BPH (benign prostatic hyperplasia)   . Diverticulitis 2012  . Hypercholesterolemia   . Hypertension   . Urine retention     Surgical History: Past Surgical History:  Procedure Laterality Date  . APPENDECTOMY  1960  . COLONOSCOPY WITH PROPOFOL N/A 11/13/2016   Procedure: COLONOSCOPY WITH PROPOFOL;  Surgeon: Manya Silvas, MD;  [facility] ENDOSCOPY;  Service: Endoscopy;  Laterality: N/A;  . ingiunal hernia repair  2016   1995, 1989  . RHINOPLASTY   1996    Home Medications:  Allergies as of 07/14/2018   No Known Allergies     Medication List        Accurate as of 07/14/18  3:25 PM. Always use your most recent med list.          aspirin 81 MG chewable tablet Chew by mouth.   atorvastatin 10 MG tablet Commonly known as:  LIPITOR Take 10 mg by mouth daily.   finasteride 5 MG tablet Commonly known as:  PROSCAR Take 1 tablet (5 mg total) by mouth daily.   lisinopril-hydrochlorothiazide 10-12.5 MG tablet Commonly known as:  PRINZIDE,ZESTORETIC   MULTIVITAMIN ADULT PO Take by mouth daily.   PROCTOZONE-HC 2.5 % rectal cream Generic drug:  hydrocortisone USE TOPICALLY THREE TIMES DAILY FOR 10 DAYS   saccharomyces boulardii 250 MG capsule Commonly known as:  FLORASTOR Take by mouth.   tamsulosin 0.4 MG Caps capsule Commonly known as:  FLOMAX Take 1 capsule (0.4 mg total) by mouth daily.       Allergies: No Known Allergies  Family History: Family History  Problem Relation Age of Onset  . Cancer Mother   . Prostate cancer Neg Hx   . Kidney cancer Neg Hx   . Bladder Cancer Neg Hx     Social History:  reports that he has quit smoking. He has never used smokeless tobacco. He reports that he drinks alcohol. He reports that he does not use drugs.  ROS: UROLOGY Frequent Urination?: Yes Hard to postpone urination?: No Burning/pain with urination?: No Get up at night to urinate?: Yes Leakage of urine?: No Urine stream starts and stops?: No Trouble starting stream?: No Do you have to strain to urinate?: No Blood in urine?: No Urinary tract infection?: No Sexually transmitted disease?: No Injury to kidneys or bladder?: No Painful intercourse?: No Weak stream?: No Erection problems?: No Penile pain?: No Gastrointestinal Nausea?: No Vomiting?: No Indigestion/heartburn?: No Diarrhea?: No Constipation?: No Constitutional Fever: No Night sweats?: No Weight loss?: No Fatigue?: No Skin Skin  rash/lesions?: No Itching?: No Eyes Blurred vision?: No Double vision?: No Ears/Nose/Throat Sore throat?: No Sinus problems?: No Hematologic/Lymphatic Swollen glands?: No Easy bruising?: No Cardiovascular Leg swelling?: No Chest pain?: No Respiratory Cough?: No Shortness of breath?: No Endocrine Excessive thirst?: No Musculoskeletal Back pain?:  No Joint pain?: No Neurological Headaches?: No Dizziness?: No Psychologic Depression?: No Anxiety?: No     Physical Exam: BP 125/79   Pulse 73   Wt 166 lb (75.3 kg)   BMI 25.24 kg/m   Constitutional: Well nourished. Alert and oriented, No acute distress. HEENT: [place] AT, moist mucus membranes. Trachea midline, no masses. Cardiovascular: No clubbing, cyanosis, or edema. Respiratory: Normal respiratory effort, no increased work of breathing. GI: Abdomen is soft, non tender, non distended, no abdominal masses. Liver and spleen not palpable.  No hernias appreciated.  Stool sample for occult testing is not indicated.   GU: No CVA tenderness.  No bladder fullness or masses.  Patient with circumcised phallus.  Urethral meatus is patent.  No penile discharge. No penile lesions or rashes. Scrotum without lesions, cysts, rashes and/or edema.  Testicles are located scrotally bilaterally. No masses are appreciated in the testicles. Left and right epididymis are normal. Rectal: Patient with  normal sphincter tone. Anus and perineum without scarring or rashes. No rectal masses are appreciated. Prostate is approximately 50 grams, no nodules are appreciated. Seminal vesicles are normal. Skin: No rashes, bruises or suspicious lesions. Lymph: No cervical or inguinal adenopathy. Neurologic: Grossly intact, no focal deficits, moving all 4 extremities. Psychiatric: Normal mood and affect.    Laboratory Data: PSA Trend  1.2 (2.4) in June 2016  1.0 (2.0) in July 2017  1.6 (3.2) in July 2018  0.9 (1.8) in July 2019 I have reviewed the  labs  Assessment & Plan:    1. BPH with LUTS  - IPSS score is 10/1, it is slightly worse   - Continue tamsulosin 0.4 mg daily and finasteride 5 mg daily; refills given  - RTC in 12 months for IPSS, PSA and exam   2. History of urinary retention:   Patient is currently voiding well on his own.  He is competent in CIC, so if he should develop retention, he will be able to relieve himself at home.  He will contact the office if any retention episodes should occur.    3. Erectile dysfunction:   SHIM score is 17, it is slightly worse.   RTC in one year for SHIM and exam.    Return in about 1 year (around 07/15/2019) for IPSS, PSA and exam.  Zara Council, [facility]  [place] [address] [place], [place] [address] [phone]

## 2018-07-14 ENCOUNTER — Ambulatory Visit: Payer: Medicare Other | Admitting: Urology

## 2018-07-14 ENCOUNTER — Other Ambulatory Visit: Payer: Self-pay

## 2018-07-14 ENCOUNTER — Encounter: Payer: Self-pay | Admitting: Urology

## 2018-07-14 VITALS — BP 125/79 | HR 73 | Wt 166.0 lb

## 2018-07-14 DIAGNOSIS — N529 Male erectile dysfunction, unspecified: Secondary | ICD-10-CM | POA: Diagnosis not present

## 2018-07-14 DIAGNOSIS — Z87898 Personal history of other specified conditions: Secondary | ICD-10-CM

## 2018-07-14 DIAGNOSIS — N138 Other obstructive and reflux uropathy: Secondary | ICD-10-CM | POA: Diagnosis not present

## 2018-07-14 DIAGNOSIS — N401 Enlarged prostate with lower urinary tract symptoms: Secondary | ICD-10-CM | POA: Diagnosis not present

## 2018-07-14 MED ORDER — FINASTERIDE 5 MG PO TABS: 5.0000 mg | ORAL_TABLET | Freq: Every day | ORAL | 3 refills | Status: DC

## 2018-07-14 MED ORDER — TAMSULOSIN HCL 0.4 MG PO CAPS
0.4000 mg | ORAL_CAPSULE | Freq: Every day | ORAL | 3 refills | Status: DC
Start: 2018-07-14 — End: 2019-07-19

## 2019-05-02 DIAGNOSIS — C4491 Basal cell carcinoma of skin, unspecified: Secondary | ICD-10-CM

## 2019-05-02 HISTORY — DX: Basal cell carcinoma of skin, unspecified: C44.91

## 2019-07-11 ENCOUNTER — Other Ambulatory Visit: Payer: Self-pay

## 2019-07-11 DIAGNOSIS — N138 Other obstructive and reflux uropathy: Secondary | ICD-10-CM

## 2019-07-11 DIAGNOSIS — Z87898 Personal history of other specified conditions: Secondary | ICD-10-CM

## 2019-07-11 DIAGNOSIS — N529 Male erectile dysfunction, unspecified: Secondary | ICD-10-CM

## 2019-07-13 ENCOUNTER — Other Ambulatory Visit: Payer: Self-pay

## 2019-07-13 ENCOUNTER — Other Ambulatory Visit: Payer: Medicare Other

## 2019-07-13 DIAGNOSIS — N401 Enlarged prostate with lower urinary tract symptoms: Secondary | ICD-10-CM

## 2019-07-13 DIAGNOSIS — N529 Male erectile dysfunction, unspecified: Secondary | ICD-10-CM

## 2019-07-13 DIAGNOSIS — Z87898 Personal history of other specified conditions: Secondary | ICD-10-CM

## 2019-07-13 DIAGNOSIS — N138 Other obstructive and reflux uropathy: Secondary | ICD-10-CM

## 2019-07-14 LAB — PSA: Prostate Specific Ag, Serum: 1 ng/mL (ref 0.0–4.0)

## 2019-07-18 NOTE — Progress Notes (Signed)
3:19 PM   MAKYA YURKO 15-Mar-1946 [id number]  Referring provider: Sofie Hartigan, MD [address] [place],  [place] [address]  Chief Complaint  Patient presents with  . Benign Prostatic Hypertrophy    HPI: Mr. Hermann is a 73 year old male who is a history of post procedure urinary retention and BPH with LU TS and ED who presents today for a 1 year follow-up.   History of urinary retention Patient had a h/o post urinary retention.  He was placed on tamsulosin and finasteride.   He is no longer having to cath himself.    BPH WITH LUTS His IPSS score today is 9, which is moderate lower urinary tract symptomatology.  He is delighted with his quality life due to his urinary symptoms.  His previous I PSS score was 10/1.  He denies any dysuria, hematuria or suprapubic pain.  He currently taking tamsulosin 0.4 mg and finasteride 5 mg daily.   He also denies any recent fevers, chills, nausea or vomiting.  He does not have a family history of PCa.  IPSS    Row Name 07/19/19 1500         International Prostate Symptom Score   How often have you had the sensation of not emptying your bladder?  Less than 1 in 5     How often have you had to urinate less than every two hours?  Less than half the time     How often have you found you stopped and started again several times when you urinated?  Less than 1 in 5 times     How often have you found it difficult to postpone urination?  Less than half the time     How often have you had a weak urinary stream?  Less than 1 in 5 times     How often have you had to strain to start urination?  Not at All     How many times did you typically get up at night to urinate?  2 Times     Total IPSS Score  9       Quality of Life due to urinary symptoms   If you were to spend the rest of your life with your urinary condition just the way it is now how would you feel about that?  Delighted        Score:  1-7 Mild 8-19 Moderate 20-35 Severe   Erectile dysfunction His SHIM score is 21, which is mild ED.   His previous SHIM score was 17.   He has been having difficulty with erections for a few months.   His major complaint is a lack of firmness.  His libido is preserved.   His risk factors for ED are age, BPH, HTN and HLD.  He denies any painful erections or curvatures with his erections.   He is no longer having spontaneous erections.  He has tried PDE5-inhibitors in the past with good results.    SHIM    Row Name 07/19/19 1512         SHIM: Over the last 6 months:   How do you rate your confidence that you could get and keep an erection?  Moderate     When you had erections with sexual stimulation, how often were your erections hard enough for penetration (entering your partner)?  Most Times (much more than half the time)     During sexual intercourse, how often were you  able to maintain your erection after you had penetrated (entered) your partner?  Most Times (much more than half the time)     During sexual intercourse, how difficult was it to maintain your erection to completion of intercourse?  Not Difficult     When you attempted sexual intercourse, how often was it satisfactory for you?  Almost Always or Always       SHIM Total Score   SHIM  21        Score: 1-7 Severe ED 8-11 Moderate ED 12-16 Mild-Moderate ED 17-21 Mild ED 22-25 No ED  PMH: Past Medical History:  Diagnosis Date  . Acute cystitis with hematuria   . Anxiety   . Arthritis   . BPH (benign prostatic hyperplasia)   . Diverticulitis 2012  . Hypercholesterolemia   . Hypertension   . Urine retention     Surgical History: Past Surgical History:  Procedure Laterality Date  . APPENDECTOMY  1960  . COLONOSCOPY WITH PROPOFOL N/A 11/13/2016   Procedure: COLONOSCOPY WITH PROPOFOL;  Surgeon: Manya Silvas, MD;  Location: [facility] ENDOSCOPY;  Service: Endoscopy;  Laterality: N/A;  . ingiunal hernia repair  2016   1995, 1989  . RHINOPLASTY  1996     Home Medications:  Allergies as of 07/19/2019   No Known Allergies     Medication List       Accurate as of July 19, 2019  3:19 PM. If you have any questions, ask your nurse or doctor.        STOP taking these medications   Proctozone-HC 2.5 % rectal cream Generic drug: hydrocortisone Stopped by: Taliesin Hartlage, PA-C   saccharomyces boulardii 250 MG capsule Commonly known as: FLORASTOR Stopped by: Zara Council, PA-C     TAKE these medications   aspirin 81 MG chewable tablet Chew by mouth.   atorvastatin 10 MG tablet Commonly known as: LIPITOR Take 10 mg by mouth daily.   finasteride 5 MG tablet Commonly known as: PROSCAR Take 1 tablet (5 mg total) by mouth daily.   lisinopril-hydrochlorothiazide 10-12.5 MG tablet Commonly known as: ZESTORETIC   MULTIVITAMIN ADULT PO Take by mouth daily.   tamsulosin 0.4 MG Caps capsule Commonly known as: FLOMAX Take 1 capsule (0.4 mg total) by mouth daily.       Allergies: No Known Allergies  Family History: Family History  Problem Relation Age of Onset  . Cancer Mother   . Prostate cancer Neg Hx   . Kidney cancer Neg Hx   . Bladder Cancer Neg Hx     Social History:  reports that he has quit smoking. He has never used smokeless tobacco. He reports current alcohol use. He reports that he does not use drugs.  ROS: UROLOGY Frequent Urination?: No Hard to postpone urination?: No Burning/pain with urination?: No Get up at night to urinate?: No Leakage of urine?: No Urine stream starts and stops?: No Trouble starting stream?: No Do you have to strain to urinate?: No Blood in urine?: No Urinary tract infection?: No Sexually transmitted disease?: No Injury to kidneys or bladder?: No Painful intercourse?: No Weak stream?: No Erection problems?: No Penile pain?: No Gastrointestinal Nausea?: No Vomiting?: No Indigestion/heartburn?: No Diarrhea?: No Constipation?: No Constitutional Fever: No Night sweats?:  No Weight loss?: No Fatigue?: No Skin Skin rash/lesions?: No Itching?: No Eyes Blurred vision?: No Double vision?: No Ears/Nose/Throat Sore throat?: No Sinus problems?: No Hematologic/Lymphatic Swollen glands?: No Easy bruising?: No Cardiovascular Leg swelling?: No Chest pain?: No Respiratory  Cough?: No Shortness of breath?: No Endocrine Excessive thirst?: No Musculoskeletal Back pain?: No Joint pain?: No Neurological Headaches?: No Dizziness?: No Psychologic Depression?: No Anxiety?: No  Physical Exam: BP 119/69 (BP Location: Left Arm, Patient Position: Sitting, Cuff Size: Normal)   Pulse 75   Ht 5\' 8"  (1.727 m)   Wt 165 lb (74.8 kg)   BMI 25.09 kg/m   Constitutional:  Well nourished. Alert and oriented, No acute distress. HEENT: [place] AT, moist mucus membranes.  Trachea midline, no masses. Cardiovascular: No clubbing, cyanosis, or edema. Respiratory: Normal respiratory effort, no increased work of breathing. GI: Abdomen is soft, non tender, non distended, no abdominal masses. Liver and spleen not palpable.  No hernias appreciated.  Stool sample for occult testing is not indicated.   GU: No CVA tenderness.  No bladder fullness or masses.  Patient with circumcised phallus.  Urethral meatus is patent.  No penile discharge. No penile lesions or rashes. Scrotum without lesions, cysts, rashes and/or edema.  Testicles are located scrotally bilaterally. No masses are appreciated in the testicles. Left and right epididymis are normal. Rectal: Patient with  normal sphincter tone. Anus and perineum without scarring or rashes. No rectal masses are appreciated. Prostate is approximately 50 grams, no nodules are appreciated.  Skin: No rashes, bruises or suspicious lesions. Lymph: No inguinal adenopathy. Neurologic: Grossly intact, no focal deficits, moving all 4 extremities. Psychiatric: Normal mood and affect.  Laboratory Data: PSA Trend  1.2 (2.4) in June 2016  1.0 (2.0) in  July 2017  1.6 (3.2) in July 2018  0.9 (1.8) in July 2019  1.0 (2.0) in July 2020  I have reviewed the labs  Assessment & Plan:    1. BPH with LUTS  - IPSS score is 9/0, it is improved   - Continue tamsulosin 0.4 mg daily and finasteride 5 mg daily; refills given  - RTC in 12 months for IPSS, PSA and exam   2. History of urinary retention:   Patient is currently voiding well on his own.  He is competent in CIC, so if he should develop retention, he will be able to relieve himself at home.  He will contact the office if any retention episodes should occur.    3. Erectile dysfunction:   SHIM score is 21, it is improved.  RTC in one year for SHIM and exam.    Return in about 1 year (around 07/18/2020) for IPSS, SHIM, PSA and exam.  Zara Council, [facility]  [place] [address], [place] [address] [phone]

## 2019-07-19 ENCOUNTER — Other Ambulatory Visit: Payer: Self-pay

## 2019-07-19 ENCOUNTER — Ambulatory Visit: Payer: Medicare Other | Admitting: Urology

## 2019-07-19 ENCOUNTER — Encounter: Payer: Self-pay | Admitting: Urology

## 2019-07-19 VITALS — BP 119/69 | HR 75 | Ht 68.0 in | Wt 165.0 lb

## 2019-07-19 DIAGNOSIS — N401 Enlarged prostate with lower urinary tract symptoms: Secondary | ICD-10-CM | POA: Diagnosis not present

## 2019-07-19 DIAGNOSIS — Z87898 Personal history of other specified conditions: Secondary | ICD-10-CM | POA: Diagnosis not present

## 2019-07-19 DIAGNOSIS — N138 Other obstructive and reflux uropathy: Secondary | ICD-10-CM | POA: Diagnosis not present

## 2019-07-19 DIAGNOSIS — N529 Male erectile dysfunction, unspecified: Secondary | ICD-10-CM

## 2019-07-19 MED ORDER — TAMSULOSIN HCL 0.4 MG PO CAPS: 0.4000 mg | ORAL_CAPSULE | Freq: Every day | ORAL | 3 refills | Status: DC

## 2019-07-19 MED ORDER — FINASTERIDE 5 MG PO TABS: 5.0000 mg | ORAL_TABLET | Freq: Every day | ORAL | 3 refills | Status: DC

## 2020-04-25 ENCOUNTER — Other Ambulatory Visit: Payer: Self-pay | Admitting: Urology

## 2020-04-25 DIAGNOSIS — N138 Other obstructive and reflux uropathy: Secondary | ICD-10-CM

## 2020-04-25 DIAGNOSIS — N401 Enlarged prostate with lower urinary tract symptoms: Secondary | ICD-10-CM

## 2020-05-06 ENCOUNTER — Other Ambulatory Visit: Payer: Self-pay

## 2020-05-06 ENCOUNTER — Ambulatory Visit: Payer: Medicare Other | Admitting: Dermatology

## 2020-05-06 ENCOUNTER — Encounter: Payer: Self-pay | Admitting: Dermatology

## 2020-05-06 DIAGNOSIS — M720 Palmar fascial fibromatosis [Dupuytren]: Secondary | ICD-10-CM

## 2020-05-06 DIAGNOSIS — Z85828 Personal history of other malignant neoplasm of skin: Secondary | ICD-10-CM | POA: Diagnosis not present

## 2020-05-06 DIAGNOSIS — L821 Other seborrheic keratosis: Secondary | ICD-10-CM

## 2020-05-06 DIAGNOSIS — L720 Epidermal cyst: Secondary | ICD-10-CM

## 2020-05-06 DIAGNOSIS — D692 Other nonthrombocytopenic purpura: Secondary | ICD-10-CM

## 2020-05-06 DIAGNOSIS — D485 Neoplasm of uncertain behavior of skin: Secondary | ICD-10-CM | POA: Diagnosis not present

## 2020-05-06 DIAGNOSIS — Z1283 Encounter for screening for malignant neoplasm of skin: Secondary | ICD-10-CM

## 2020-05-06 DIAGNOSIS — D229 Melanocytic nevi, unspecified: Secondary | ICD-10-CM

## 2020-05-06 DIAGNOSIS — D1801 Hemangioma of skin and subcutaneous tissue: Secondary | ICD-10-CM

## 2020-05-06 DIAGNOSIS — L814 Other melanin hyperpigmentation: Secondary | ICD-10-CM

## 2020-05-06 DIAGNOSIS — D239 Other benign neoplasm of skin, unspecified: Secondary | ICD-10-CM

## 2020-05-06 DIAGNOSIS — L578 Other skin changes due to chronic exposure to nonionizing radiation: Secondary | ICD-10-CM

## 2020-05-06 HISTORY — DX: Other benign neoplasm of skin, unspecified: D23.9

## 2020-05-06 NOTE — Progress Notes (Signed)
Follow-Up Visit   Subjective  Cole Holt is a 74 y.o. male who presents for the following: Annual Exam (History of BCC right postauricular crease - excised 05/02/2019). The patient presents for upper body skin examination today for skin cancer screening and mole check. He also complains of cords and contractions of his fingers and palm.   The following portions of the chart were reviewed this encounter and updated as appropriate:  Tobacco  Allergies  Meds  Problems  Med Hx  Surg Hx  Fam Hx      Review of Systems:  No other skin or systemic complaints except as noted in HPI or Assessment and Plan.  Objective  Well appearing patient in no apparent distress; mood and affect are within normal limits.  All skin waist up examined.  Objective  upper back spinal: Subcutaneous nodule.   Objective  Right Postauricular Area: Well healed scar with no evidence of recurrence.   Objective  Right sup medial scapula: 0.4 cm irregular brown macule.   Assessment & Plan    Skin cancer screening performed today.  Actinic Damage - diffuse scaly erythematous macules with underlying dyspigmentation - Recommend daily broad spectrum sunscreen SPF 30+ to sun-exposed areas, reapply every 2 hours as needed.  - Call for new or changing lesions.  Purpura - Violaceous macules and patches - Benign - Related to age, sun damage and/or use of blood thinners - Observe - Can use OTC arnica containing moisturizer such as Dermend Bruise Formula if desired - Call for worsening or other concerns  Seborrheic Keratoses - Stuck-on, waxy, tan-brown papules and plaques  - Discussed benign etiology and prognosis. - Observe - Call for any changes  Lentigines - Scattered tan macules - Discussed due to sun exposure - Benign, observe - Call for any changes  Hemangiomas - Red papules - Discussed benign nature - Observe - Call for any changes  Melanocytic Nevi - Tan-brown and/or  pink-flesh-colored symmetric macules and papules - Benign appearing on exam today - Observation - Call clinic for new or changing moles - Recommend daily use of broad spectrum spf 30+ sunscreen to sun-exposed areas.    Dupuytren's contracture Right Mid Palm  Will refer patient to Dr. Earnestine Leys at [facility].  Other Related Procedures Ambulatory referral to Orthopedic Surgery  Epidermal inclusion cyst upper back spinal  Discussed excision.  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 Observe.  Neoplasm of uncertain behavior of skin Right sup medial scapula  Epidermal / dermal shaving  Lesion length (cm):  0.4 Lesion width (cm):  0.4 Margin per side (cm):  0.2 Total excision diameter (cm):  0.8 Informed consent: discussed and consent obtained   Timeout: patient name, date of birth, surgical site, and procedure verified   Procedure prep:  Patient was prepped and draped in usual sterile fashion Prep type:  Isopropyl alcohol Anesthesia: the lesion was anesthetized in a standard fashion   Anesthetic:  1% lidocaine w/ epinephrine 1-100,000 buffered w/ 8.4% NaHCO3 Instrument used: flexible razor blade   Hemostasis achieved with: pressure, aluminum chloride and electrodesiccation   Outcome: patient tolerated procedure well   Post-procedure details: sterile dressing applied and wound care instructions given   Dressing type: bandage and petrolatum    Specimen 1 - Surgical pathology Differential Diagnosis: Nevus vs dysplastic nevus Check Margins: No 0.4 cm irregular brown macule.  Skin cancer screening  Return in about 6 months (around 11/06/2020) for Surgery appointment for cyst and  follow up appointment.   I, Ashok Cordia, CMA, am acting as scribe for Sarina Ser, MD .  Documentation: I have reviewed the above documentation for accuracy and completeness, and I agree with the above.  Sarina Ser, MD

## 2020-05-06 NOTE — Patient Instructions (Signed)

## 2020-05-09 ENCOUNTER — Telehealth: Payer: Self-pay

## 2020-05-09 NOTE — Telephone Encounter (Signed)
-----   Message from Ralene Bathe, MD sent at 05/09/2020 12:10 PM EDT ----- Skin , right sup medial scapula DYSPLASTIC JUNCTIONAL NEVUS WITH SEVERE ATYPIA  Severe dysplastic Schedule surgery

## 2020-05-09 NOTE — Telephone Encounter (Signed)
Lft pt msg to call for bx results/sh °

## 2020-05-10 ENCOUNTER — Telehealth: Payer: Self-pay

## 2020-05-10 NOTE — Telephone Encounter (Signed)
Per Mariann Laster patient scheduled for appointment with Dr. Sabra Heck on 05/16/20 at 2:45.

## 2020-05-10 NOTE — Telephone Encounter (Signed)
Discussed biopsy results with pt  °

## 2020-05-20 ENCOUNTER — Telehealth: Payer: Self-pay

## 2020-05-20 NOTE — Telephone Encounter (Signed)
Patient's office notes from Dr. Earnestine Leys, [facility] are in his EMR. You will find these under Chart Review and his Media tab.

## 2020-06-10 ENCOUNTER — Telehealth: Payer: Self-pay

## 2020-06-10 NOTE — Telephone Encounter (Signed)
Ideally Meloxicam should be discontinued, but if he would be in pain without it, he may take it.  It may make bleed or bruise more than without it.

## 2020-06-10 NOTE — Telephone Encounter (Signed)
Should patient d/c Meloxicam before surgery with you on the 29th?

## 2020-06-11 NOTE — Telephone Encounter (Signed)
Patient advised of information per Dr. Kowalski.  

## 2020-06-11 NOTE — Telephone Encounter (Signed)
How many does prior should patient d/c medication?

## 2020-06-11 NOTE — Telephone Encounter (Signed)
2 days prior to surgery

## 2020-06-25 ENCOUNTER — Other Ambulatory Visit: Payer: Self-pay

## 2020-06-25 ENCOUNTER — Telehealth: Payer: Self-pay

## 2020-06-25 ENCOUNTER — Encounter: Payer: Self-pay | Admitting: Dermatology

## 2020-06-25 ENCOUNTER — Ambulatory Visit: Payer: Medicare Other | Admitting: Dermatology

## 2020-06-25 DIAGNOSIS — D235 Other benign neoplasm of skin of trunk: Secondary | ICD-10-CM

## 2020-06-25 DIAGNOSIS — D239 Other benign neoplasm of skin, unspecified: Secondary | ICD-10-CM

## 2020-06-25 MED ORDER — MUPIROCIN 2 % EX OINT: 1.0000 "application " | TOPICAL_OINTMENT | Freq: Every day | CUTANEOUS | 0 refills | Status: DC

## 2020-06-25 NOTE — Progress Notes (Signed)
   Follow-Up Visit   Subjective  Cole Holt is a 74 y.o. male who presents for the following: Procedure (Severe dysplastic nevus - Excise today.).  The following portions of the chart were reviewed this encounter and updated as appropriate:  Tobacco  Allergies  Meds  Problems  Med Hx  Surg Hx  Fam Hx      Review of Systems:  No other skin or systemic complaints except as noted in HPI or Assessment and Plan.  Objective  Well appearing patient in no apparent distress; mood and affect are within normal limits.  A focused examination was performed including back. Relevant physical exam findings are noted in the Assessment and Plan.  Objective  Right sup med scapula: Healing biopsy site   Assessment & Plan    Dysplastic nevus Right sup med scapula  Skin excision - Right sup med scapula  Lesion length (cm):  0.9 Lesion width (cm):  0.7 Margin per side (cm):  0.2 Total excision diameter (cm):  1.3 Informed consent: discussed and consent obtained   Timeout: patient name, date of birth, surgical site, and procedure verified   Procedure prep:  Patient was prepped and draped in usual sterile fashion Prep type:  Isopropyl alcohol and povidone-iodine Anesthesia: the lesion was anesthetized in a standard fashion   Anesthetic:  1% lidocaine w/ epinephrine 1-100,000 buffered w/ 8.4% NaHCO3 Instrument used: #15 blade   Hemostasis achieved with: pressure   Hemostasis achieved with comment:  Electrocautery Outcome: patient tolerated procedure well with no complications   Post-procedure details: sterile dressing applied and wound care instructions given   Dressing type: bandage and pressure dressing (mupirocin)    Skin repair - Right sup med scapula Complexity:  Complex Final length (cm):  4.5 Reason for type of repair: reduce tension to allow closure, reduce the risk of dehiscence, infection, and necrosis, reduce subcutaneous dead space and avoid a hematoma, allow closure  of the large defect, preserve normal anatomy, preserve normal anatomical and functional relationships and enhance both functionality and cosmetic results   Undermining: area extensively undermined   Undermining comment:  Undermining defect - 1.5 cm Subcutaneous layers (deep stitches):  Suture size:  2-0 Suture type: Vicryl (polyglactin 910)   Subcutaneous suture technique: inverted dermal. Fine/surface layer approximation (top stitches):  Suture size:  3-0 Suture type: nylon   Stitches: simple running   Suture removal (days):  7 Hemostasis achieved with: suture and pressure Outcome: patient tolerated procedure well with no complications   Post-procedure details: sterile dressing applied and wound care instructions given   Dressing type: bandage and pressure dressing (mupirocin)    Specimen 1 - Surgical pathology Differential Diagnosis: Severe dysplastic nevus  Check Margins: Yes Healing biopsy site [id number]  Return in about 1 week (around 07/02/2020).   I, Ashok Cordia, CMA, am acting as scribe for Sarina Ser, MD .  Documentation: I have reviewed the above documentation for accuracy and completeness, and I agree with the above.  Sarina Ser, MD

## 2020-06-25 NOTE — Telephone Encounter (Signed)
Pt doing well after today's surgery.  Discussed pain regimen with pt and advised to call office if any problems./sh

## 2020-06-25 NOTE — Patient Instructions (Signed)

## 2020-07-02 ENCOUNTER — Other Ambulatory Visit: Payer: Self-pay

## 2020-07-02 ENCOUNTER — Ambulatory Visit: Payer: Medicare Other

## 2020-07-02 DIAGNOSIS — D239 Other benign neoplasm of skin, unspecified: Secondary | ICD-10-CM

## 2020-07-02 NOTE — Progress Notes (Signed)
   Follow-Up Visit   Subjective  Cole Holt is a 74 y.o. male who presents for the following: Follow-up (Post op Dysplastic nevus Margins free of right sup med scapula).    The following portions of the chart were reviewed this encounter and updated as appropriate:      Review of Systems:  No other skin or systemic complaints except as noted in HPI or Assessment and Plan.  Objective  Well appearing patient in no apparent distress; mood and affect are within normal limits.  A focused examination was performed including back. Relevant physical exam findings are noted in the Assessment and Plan.  Objective  Right sup med scapula: Healing excision site.   Assessment & Plan  Dysplastic nevus Right sup med scapula  Excision site healing well. Wound cleansed, sutures removed, steristrips applied. Pathology results discussed with patient.  Return for as scheduled.

## 2020-07-08 ENCOUNTER — Encounter: Payer: Self-pay | Admitting: Dermatology

## 2020-07-15 ENCOUNTER — Other Ambulatory Visit: Payer: Self-pay | Admitting: Urology

## 2020-07-15 ENCOUNTER — Other Ambulatory Visit: Payer: Self-pay

## 2020-07-15 DIAGNOSIS — N401 Enlarged prostate with lower urinary tract symptoms: Secondary | ICD-10-CM

## 2020-07-16 ENCOUNTER — Other Ambulatory Visit: Payer: Medicare Other

## 2020-07-16 ENCOUNTER — Other Ambulatory Visit: Payer: Self-pay

## 2020-07-16 DIAGNOSIS — N138 Other obstructive and reflux uropathy: Secondary | ICD-10-CM

## 2020-07-17 LAB — PSA: Prostate Specific Ag, Serum: 1 ng/mL (ref 0.0–4.0)

## 2020-07-22 NOTE — Progress Notes (Signed)
Error

## 2020-07-23 ENCOUNTER — Encounter: Payer: Self-pay | Admitting: Urology

## 2020-07-23 ENCOUNTER — Other Ambulatory Visit: Payer: Self-pay

## 2020-07-23 ENCOUNTER — Ambulatory Visit (INDEPENDENT_AMBULATORY_CARE_PROVIDER_SITE_OTHER): Payer: Medicare Other | Admitting: Urology

## 2020-07-23 VITALS — BP 136/82 | HR 76 | Ht 68.0 in | Wt 155.0 lb

## 2020-07-23 DIAGNOSIS — N529 Male erectile dysfunction, unspecified: Secondary | ICD-10-CM

## 2020-07-23 DIAGNOSIS — N401 Enlarged prostate with lower urinary tract symptoms: Secondary | ICD-10-CM | POA: Diagnosis not present

## 2020-07-23 DIAGNOSIS — N138 Other obstructive and reflux uropathy: Secondary | ICD-10-CM | POA: Diagnosis not present

## 2020-07-23 NOTE — Progress Notes (Signed)
10:16 PM   Cole Holt [birth year] [id number]  Referring provider: Sofie Hartigan, MD [address],  [place] [address]  Chief Complaint  Patient presents with  . Benign Prostatic Hypertrophy    HPI: Cole Holt is a 74 year old male who is a history of post procedure urinary retention and BPH with LU TS and ED who presents today for a 1 year follow-up.   BPH WITH LUTS  (prostate and/or bladder) IPSS score: 14/2 Previous score: 9/0   Previous PVR: 50 mL   Major complaint(s): None.   Denies any dysuria, hematuria or suprapubic pain.   Currently taking: tamsulosin 0.4 mg daily and finasteride 5 mg daily   Denies any recent fevers, chills, nausea or vomiting.  UA is negative.     IPSS    Row Name 07/23/20 1400         International Prostate Symptom Score   How often have you had the sensation of not emptying your bladder? Less than half the time     How often have you had to urinate less than every two hours? About half the time     How often have you found you stopped and started again several times when you urinated? Less than half the time     How often have you found it difficult to postpone urination? Less than half the time     How often have you had a weak urinary stream? Less than half the time     How often have you had to strain to start urination? Less than 1 in 5 times     How many times did you typically get up at night to urinate? 2 Times     Total IPSS Score 14       Quality of Life due to urinary symptoms   If you were to spend the rest of your life with your urinary condition just the way it is now how would you feel about that? Mostly Satisfied            Score:  1-7 Mild 8-19 Moderate 20-35 Severe  Erectile dysfunction His SHIM score is 15, which is mild to moderate ED.   His previous SHIM score was 21.   He has been having difficulty with erections for a little over a year.   His major complaint is a lack of firmness.  His  libido is preserved.   His risk factors for ED are age, BPH, HTN and HLD.  He denies any pain or curvature with erections.  He is no longer having spontaneous erections.  He is hesitant to take PDE 5 inhibitors due to the side effects.      SHIM    Row Name 07/23/20 1455         SHIM: Over the last 6 months:   How do you rate your confidence that you could get and keep an erection? Moderate     When you had erections with sexual stimulation, how often were your erections hard enough for penetration (entering your partner)? Sometimes (about half the time)     During sexual intercourse, how often were you able to maintain your erection after you had penetrated (entered) your partner? Sometimes (about half the time)     During sexual intercourse, how difficult was it to maintain your erection to completion of intercourse? Difficult     When you attempted sexual intercourse, how often was it satisfactory for  you? Sometimes (about half the time)       SHIM Total Score   SHIM 15            Score: 1-7 Severe ED 8-11 Moderate ED 12-16 Mild-Moderate ED 17-21 Mild ED 22-25 No ED  PMH: Past Medical History:  Diagnosis Date  . Acute cystitis with hematuria   . Anxiety   . Arthritis   . Basal cell carcinoma 05/02/2019   right post auricular crease/excision  . BPH (benign prostatic hyperplasia)   . Diverticulitis 2012  . Dysplastic nevus 06/25/2020   Right sup. medial scapula. Severe atypia, close to margin.  Marland Kitchen Hypercholesterolemia   . Hypertension   . Urine retention     Surgical History: Past Surgical History:  Procedure Laterality Date  . APPENDECTOMY  1960  . COLONOSCOPY WITH PROPOFOL N/A 11/13/2016   Procedure: COLONOSCOPY WITH PROPOFOL;  Surgeon: Manya Silvas, MD;  Location[facility] ENDOSCOPY;  Service: Endoscopy;  Laterality: N/A;  . ingiunal hernia repair  2016   1995, 1989  . RHINOPLASTY  1996    Home Medications:  Allergies as of 07/23/2020   No Known Allergies      Medication List       Accurate as of July 23, 2020 10:16 PM. If you have any questions, ask your nurse or doctor.        STOP taking these medications   predniSONE 10 MG tablet Commonly known as: DELTASONE Stopped by: Zara Council, PA-C     TAKE these medications   aspirin 81 MG chewable tablet Chew by mouth.   atorvastatin 10 MG tablet Commonly known as: LIPITOR Take 10 mg by mouth daily.   finasteride 5 MG tablet Commonly known as: PROSCAR TAKE 1 TABLET BY MOUTH  DAILY   lisinopril-hydrochlorothiazide 10-12.5 MG tablet Commonly known as: ZESTORETIC   meloxicam 15 MG tablet Commonly known as: MOBIC Take 15 mg by mouth daily.   MULTIVITAMIN ADULT PO Take by mouth daily.   tamsulosin 0.4 MG Caps capsule Commonly known as: FLOMAX TAKE 1 CAPSULE BY MOUTH  DAILY       Allergies: No Known Allergies  Family History: Family History  Problem Relation Age of Onset  . Cancer Mother   . Prostate cancer Neg Hx   . Kidney cancer Neg Hx   . Bladder Cancer Neg Hx     Social History:  reports that he has quit smoking. He has never used smokeless tobacco. He reports current alcohol use. He reports that he does not use drugs.  ROS: For pertinent review of systems please refer to history of present illness  Physical Exam: BP (!) 136/82   Pulse 76   Ht 5\' 8"  (1.727 m)   Wt 155 lb (70.3 kg)   BMI 23.57 kg/m   Constitutional:  Well nourished. Alert and oriented, No acute distress. HEENT: [place] AT, mask in place. Trachea midline Cardiovascular: No clubbing, cyanosis, or edema. Respiratory: Normal respiratory effort, no increased work of breathing. GI: Abdomen is soft, non tender, non distended, no abdominal masses. Liver and spleen not palpable.  No hernias appreciated.  Stool sample for occult testing is not indicated.   GU: No CVA tenderness.  No bladder fullness or masses.  Patient with circumcised phallus.   Urethral meatus is patent.  No penile discharge. No  penile lesions or rashes. Scrotum without lesions, cysts, rashes and/or edema.  Testicles are located scrotally bilaterally. No masses are appreciated in the testicles. Left and right epididymis  are normal.  Left varicocele is present. Rectal: Patient with  normal sphincter tone. Anus and perineum without scarring or rashes. No rectal masses are appreciated. Prostate is approximately 50 grams, no nodules are appreciated. Seminal vesicles could not be palpated  Skin: No rashes, bruises or suspicious lesions. Lymph: No inguinal adenopathy. Neurologic: Grossly intact, no focal deficits, moving all 4 extremities. Psychiatric: Normal mood and affect.  Laboratory Data: PSA Trend  1.2 (2.4) in June 2016  1.0 (2.0) in July 2017  1.6 (3.2) in July 2018  0.9 (1.8) in July 2019  1.0 (2.0) in July 2020  1.0 (2.0) in July 2021  Urinalysis Component     Latest Ref Rng & Units 07/23/2020  Specific Gravity, UA     1.005 - 1.030 1.010  pH, UA     5.0 - 7.5 6.5  Color, UA     Yellow Yellow  Appearance Ur     Clear Clear  Leukocytes,UA     Negative Negative  Protein,UA     Negative/Trace Negative  Glucose, UA     Negative Negative  Ketones, UA     Negative Negative  RBC, UA     Negative Negative  Bilirubin, UA     Negative Negative  Urobilinogen, Ur     0.2 - 1.0 mg/dL 0.2  Nitrite, UA     Negative Negative  Microscopic Examination      See below:   Component     Latest Ref Rng & Units 07/23/2020  WBC, UA     0 - 5 /hpf 0-5  RBC     0 - 2 /hpf 0-2  Epithelial Cells (non renal)     0 - 10 /hpf 0-10  Bacteria, UA     None seen/Few None seen   I have reviewed the labs  Assessment & Plan:    1. BPH with LUTS IPSS score is 14/2, it is slightly worse Continue tamsulosin 0.4 mg daily and finasteride 5 mg daily RTC in 12 months for IPSS, PSA and exam   2. Erectile dysfunction:   SHIM score is 15, it is worsened but the patient is not interested in pursuing treatment at this time.    Return in about 1 year (around 07/23/2021) for IPSS, PSA and exam.  [address], [place] [address] [phone]  I, Joneen Boers Peace, am acting as a Education administrator for [employer].  I have reviewed the above documentation for accuracy and completeness, and I agree with the above.    Zara Council, PA-C

## 2020-07-24 LAB — URINALYSIS, COMPLETE
Bilirubin, UA: NEGATIVE
Glucose, UA: NEGATIVE
Ketones, UA: NEGATIVE
Leukocytes,UA: NEGATIVE
Nitrite, UA: NEGATIVE
Protein,UA: NEGATIVE
RBC, UA: NEGATIVE
Specific Gravity, UA: 1.01 (ref 1.005–1.030)
Urobilinogen, Ur: 0.2 mg/dL (ref 0.2–1.0)
pH, UA: 6.5 (ref 5.0–7.5)

## 2020-07-24 LAB — MICROSCOPIC EXAMINATION: Bacteria, UA: NONE SEEN

## 2020-11-05 ENCOUNTER — Other Ambulatory Visit: Payer: Self-pay

## 2020-11-05 ENCOUNTER — Ambulatory Visit (INDEPENDENT_AMBULATORY_CARE_PROVIDER_SITE_OTHER): Payer: Medicare Other | Admitting: Dermatology

## 2020-11-05 ENCOUNTER — Encounter: Payer: Self-pay | Admitting: Dermatology

## 2020-11-05 DIAGNOSIS — L720 Epidermal cyst: Secondary | ICD-10-CM | POA: Diagnosis not present

## 2020-11-05 DIAGNOSIS — D229 Melanocytic nevi, unspecified: Secondary | ICD-10-CM | POA: Diagnosis not present

## 2020-11-05 DIAGNOSIS — L820 Inflamed seborrheic keratosis: Secondary | ICD-10-CM

## 2020-11-05 DIAGNOSIS — Z85828 Personal history of other malignant neoplasm of skin: Secondary | ICD-10-CM

## 2020-11-05 DIAGNOSIS — Z1283 Encounter for screening for malignant neoplasm of skin: Secondary | ICD-10-CM | POA: Diagnosis not present

## 2020-11-05 DIAGNOSIS — Z86018 Personal history of other benign neoplasm: Secondary | ICD-10-CM

## 2020-11-05 DIAGNOSIS — L821 Other seborrheic keratosis: Secondary | ICD-10-CM | POA: Diagnosis not present

## 2020-11-05 MED ORDER — MUPIROCIN 2 % EX OINT: 1.0000 "application " | TOPICAL_OINTMENT | Freq: Every day | CUTANEOUS | 1 refills | Status: DC

## 2020-11-05 NOTE — Progress Notes (Signed)
Follow-Up Visit   Subjective  Cole Holt is a 74 y.o. male who presents for the following: Cyst (vs other of upper back spinal - Excise today) and Upper body skin exam (upper body, hx of BCC, Severe dysplastic). The patient presents for Upper Body Skin Exam (UBSE) for skin cancer screening and mole check.  The following portions of the chart were reviewed this encounter and updated as appropriate:  Tobacco  Allergies  Meds  Problems  Med Hx  Surg Hx  Fam Hx     Review of Systems:  No other skin or systemic complaints except as noted in HPI or Assessment and Plan.  Objective  Well appearing patient in no apparent distress; mood and affect are within normal limits.  All skin waist up examined.  Objective  Upper back spinal: Cystic pap  Objective  L scalp  x 1: Erythematous keratotic or waxy stuck-on papule or plaque.    Assessment & Plan    History of Basal Cell Carcinoma of the Skin - No evidence of recurrence today - Recommend regular full body skin exams - Recommend daily broad spectrum sunscreen SPF 30+ to sun-exposed areas, reapply every 2 hours as needed.  - Call if any new or changing lesions are noted between office visits - R post auricular crease  Melanocytic Nevi - Tan-brown and/or pink-flesh-colored symmetric macules and papules - Benign appearing on exam today - Observation - Call clinic for new or changing moles - Recommend daily use of broad spectrum spf 30+ sunscreen to sun-exposed areas.   Seborrheic Keratoses - Stuck-on, waxy, tan-brown papules and plaques  - Discussed benign etiology and prognosis. - Observe - Call for any changes  Skin cancer screening performed today.   Epidermal cyst Upper back spinal  Start Mupirocin oint qd  Skin excision - Upper back spinal  Lesion length (cm):  2.5 Lesion width (cm):  2.5 Margin per side (cm):  0.1 Total excision diameter (cm):  2.7 Informed consent: discussed and consent obtained    Timeout: patient name, date of birth, surgical site, and procedure verified   Procedure prep:  Patient was prepped and draped in usual sterile fashion Prep type:  Isopropyl alcohol and povidone-iodine Anesthesia: the lesion was anesthetized in a standard fashion   Anesthetic:  1% lidocaine w/ epinephrine 1-100,000 buffered w/ 8.4% NaHCO3 (10.0cc) Instrument used: #15 blade   Hemostasis achieved with: pressure   Hemostasis achieved with comment:  Electrocautery Outcome: patient tolerated procedure well with no complications   Post-procedure details: sterile dressing applied and wound care instructions given   Dressing type: bandage and pressure dressing (Mupirocin)    Skin repair - Upper back spinal Complexity:  Complex Final length (cm):  3.5 Reason for type of repair: reduce tension to allow closure, reduce the risk of dehiscence, infection, and necrosis, reduce subcutaneous dead space and avoid a hematoma, allow closure of the large defect, preserve normal anatomy, preserve normal anatomical and functional relationships and enhance both functionality and cosmetic results   Undermining: area extensively undermined   Undermining comment:  Undermining Defect 2.7cm Subcutaneous layers (deep stitches):  Suture size:  2-0 Suture type: Vicryl (polyglactin 910)   Subcutaneous suture technique: Inverted Dermal. Fine/surface layer approximation (top stitches):  Suture size:  3-0 Suture type: nylon   Stitches: simple running   Suture removal (days):  7 Hemostasis achieved with: pressure Outcome: patient tolerated procedure well with no complications   Post-procedure details: sterile dressing applied and wound care instructions given  Dressing type: bandage, pressure dressing and bacitracin (Mupirocin)    mupirocin ointment (BACTROBAN) 2 % - Upper back spinal  Specimen 1 - Surgical pathology Differential Diagnosis: D48.5 Cyst vs other Check Margins: No Cystic pap 2.5cm  Inflamed  seborrheic keratosis L scalp  x 1  Destruction of lesion - L scalp  x 1 Complexity: simple   Destruction method: cryotherapy   Informed consent: discussed and consent obtained   Timeout:  patient name, date of birth, surgical site, and procedure verified Lesion destroyed using liquid nitrogen: Yes   Region frozen until ice ball extended beyond lesion: Yes   Outcome: patient tolerated procedure well with no complications   Post-procedure details: wound care instructions given    Return in about 1 week (around 11/12/2020) for suture removal.   I, Cole Holt, RMA, am acting as scribe for Cole Ser, MD .  Documentation: I have reviewed the above documentation for accuracy and completeness, and I agree with the above.  Cole Ser, MD

## 2020-11-05 NOTE — Patient Instructions (Signed)

## 2020-11-06 ENCOUNTER — Telehealth: Payer: Self-pay

## 2020-11-06 NOTE — Telephone Encounter (Signed)
Patient had some mild discomfort after yesterday's surgery.  He said he took the tylenol/iburpofen regimen on post op instructions and has not had any pain since.  Advised pt to call the office if any problems otherwise we would see him next week at his post op appt./sh

## 2020-11-12 ENCOUNTER — Ambulatory Visit (INDEPENDENT_AMBULATORY_CARE_PROVIDER_SITE_OTHER): Payer: Medicare Other | Admitting: Dermatology

## 2020-11-12 ENCOUNTER — Other Ambulatory Visit: Payer: Self-pay

## 2020-11-12 DIAGNOSIS — L720 Epidermal cyst: Secondary | ICD-10-CM

## 2020-11-12 DIAGNOSIS — Z4802 Encounter for removal of sutures: Secondary | ICD-10-CM

## 2020-11-12 NOTE — Progress Notes (Signed)
   Follow-Up Visit   Subjective  Cole Holt is a 74 y.o. male who presents for the following: post op/suture removal (patient is here today to have his sutures removed from a pathology proven benign cyst on the upper back spinal).  The following portions of the chart were reviewed this encounter and updated as appropriate:  Tobacco  Allergies  Meds  Problems  Med Hx  Surg Hx  Fam Hx     Review of Systems:  No other skin or systemic complaints except as noted in HPI or Assessment and Plan.  Objective  Well appearing patient in no apparent distress; mood and affect are within normal limits.  A focused examination was performed including the back. Relevant physical exam findings are noted in the Assessment and Plan.  Objective  Upper back spinal: Healing excision site  Assessment & Plan  Epidermal inclusion cyst Upper back spinal  Encounter for Removal of Sutures - Incision site at the upper back spinal is clean, dry and intact - Wound cleansed, sutures removed, wound cleansed and steri strips applied.  - Discussed pathology results showing a benign cyst  - Patient advised to keep steri-strips dry until they fall off. - Scars remodel for a full year. - Once steri-strips fall off, patient can apply over-the-counter silicone scar cream each night to help with scar remodeling if desired. - Patient advised to call with any concerns or if they notice any new or changing lesions.   Return in about 1 year (around 11/12/2021) for TBSE.  Luther Redo, CMA, am acting as scribe for Sarina Ser, MD .  Documentation: I have reviewed the above documentation for accuracy and completeness, and I agree with the above.  Sarina Ser, MD

## 2020-11-15 ENCOUNTER — Encounter: Payer: Self-pay | Admitting: Dermatology

## 2020-12-08 ENCOUNTER — Emergency Department
Admission: EM | Admit: 2020-12-08 | Discharge: 2020-12-08 | Disposition: A | Discharge status: Home / Self Care | Payer: Medicare Other | Attending: Emergency Medicine | Admitting: Emergency Medicine

## 2020-12-08 ENCOUNTER — Emergency Department: Payer: Medicare Other

## 2020-12-08 ENCOUNTER — Other Ambulatory Visit: Payer: Self-pay

## 2020-12-08 ENCOUNTER — Encounter: Payer: Self-pay | Admitting: Emergency Medicine

## 2020-12-08 ENCOUNTER — Ambulatory Visit
Admission: EM | Admit: 2020-12-08 | Discharge: 2020-12-08 | Disposition: A | Discharge status: Home / Self Care | Payer: Medicare Other

## 2020-12-08 DIAGNOSIS — W19XXXA Unspecified fall, initial encounter: Secondary | ICD-10-CM | POA: Insufficient documentation

## 2020-12-08 DIAGNOSIS — R251 Tremor, unspecified: Secondary | ICD-10-CM

## 2020-12-08 DIAGNOSIS — I10 Essential (primary) hypertension: Secondary | ICD-10-CM | POA: Diagnosis not present

## 2020-12-08 DIAGNOSIS — Z79899 Other long term (current) drug therapy: Secondary | ICD-10-CM | POA: Diagnosis not present

## 2020-12-08 DIAGNOSIS — S0081XA Abrasion of other part of head, initial encounter: Secondary | ICD-10-CM | POA: Insufficient documentation

## 2020-12-08 DIAGNOSIS — R5383 Other fatigue: Secondary | ICD-10-CM

## 2020-12-08 DIAGNOSIS — S069X9A Unspecified intracranial injury with loss of consciousness of unspecified duration, initial encounter: Secondary | ICD-10-CM

## 2020-12-08 DIAGNOSIS — R55 Syncope and collapse: Secondary | ICD-10-CM | POA: Insufficient documentation

## 2020-12-08 DIAGNOSIS — Z85828 Personal history of other malignant neoplasm of skin: Secondary | ICD-10-CM | POA: Insufficient documentation

## 2020-12-08 DIAGNOSIS — Z7982 Long term (current) use of aspirin: Secondary | ICD-10-CM | POA: Diagnosis not present

## 2020-12-08 DIAGNOSIS — R6883 Chills (without fever): Secondary | ICD-10-CM

## 2020-12-08 DIAGNOSIS — Z87891 Personal history of nicotine dependence: Secondary | ICD-10-CM | POA: Diagnosis not present

## 2020-12-08 LAB — CBC
HCT: 42.2 % (ref 39.0–52.0)
Hemoglobin: 14.6 g/dL (ref 13.0–17.0)
MCH: 32.7 pg (ref 26.0–34.0)
MCHC: 34.6 g/dL (ref 30.0–36.0)
MCV: 94.6 fL (ref 80.0–100.0)
Platelets: 216 10*3/uL (ref 150–400)
RBC: 4.46 MIL/uL (ref 4.22–5.81)
RDW: 12 % (ref 11.5–15.5)
WBC: 5.4 10*3/uL (ref 4.0–10.5)
nRBC: 0 % (ref 0.0–0.2)

## 2020-12-08 LAB — BASIC METABOLIC PANEL
Anion gap: 11 (ref 5–15)
BUN: 12 mg/dL (ref 8–23)
CO2: 26 mmol/L (ref 22–32)
Calcium: 9.3 mg/dL (ref 8.9–10.3)
Chloride: 98 mmol/L (ref 98–111)
Creatinine, Ser: 0.74 mg/dL (ref 0.61–1.24)
GFR, Estimated: >60 mL/min (ref >60)
Glucose, Bld: 109 mg/dL — ABNORMAL HIGH (ref 70–99)
Potassium: 3.5 mmol/L (ref 3.5–5.1)
Sodium: 135 mmol/L (ref 135–145)

## 2020-12-08 LAB — CBG MONITORING, ED: Glucose-Capillary: 94 mg/dL (ref 70–99)

## 2020-12-08 LAB — TROPONIN I (HIGH SENSITIVITY)
Troponin I (High Sensitivity): 8 ng/L (ref <18)
Troponin I (High Sensitivity): 9 ng/L (ref <18)

## 2020-12-08 NOTE — Discharge Instructions (Signed)
I do recommend that you seek further evaluation in the emergency department, now, to further evaluate the chills/shakiness you are experiencing fallowing your fall and head injury.

## 2020-12-08 NOTE — ED Triage Notes (Signed)
Patient in today c/o chills and fatigue x 3 days. Patient states he got up in the middle of the night on (12/06/20) and states he had a syncopal episode and hit his head on an exercise bike. Patient states he doesn't know how long he was unconscious and no one witness the episode. Patient states he rested Friday and felt better Saturday, but today he states he is really fatigue and "shaking".

## 2020-12-08 NOTE — ED Provider Notes (Signed)
[facility] Emergency Department Provider Note  ____________________________________________   Event Date/Time   First MD Initiated Contact with Patient 12/08/20 1718     (approximate)  I have reviewed the triage vital signs and the nursing notes.   HISTORY  Chief Complaint Loss of Consciousness   HPI Cole Holt is a 74 y.o. male with a past medical history of arthritis, anxiety, BPH, HTN and HDL who presents for assessment of pain referred to the ED for further evaluation after a syncopal episode and fall that occurred on 12/10.  Patient denies any passing out or falling since then.  States he has felt a little bit shaky since then and has a little bit of residual right headache where he thinks he struck his head but no other acute pain.  States he did lose consciousness and felt lightheaded before passing out and when he woke up he had some headache but no other acute pain.  Denies any chest pain, shortness of breath, cough, fevers, chills, nausea, vomiting, diarrhea, dysuria, rash, extremity pain, dizziness, vertigo or other recent falls or injuries.  Denies being on any blood thinners.  States he has been eating and drinking okay and denies any urinary symptoms.  Denies EtOH or illicit drug use.         Past Medical History:  Diagnosis Date  . Acute cystitis with hematuria   . Anxiety   . Arthritis   . Basal cell carcinoma 05/02/2019   right post auricular crease/excision  . BPH (benign prostatic hyperplasia)   . Diverticulitis 2012  . Dysplastic nevus 05/06/2020   Right sup. medial scapula. Severe atypia, close to margin. Excised 06/25/2020, margins free.  Marland Kitchen Hypercholesterolemia   . Hypertension   . Urine retention     Patient Active Problem List   Diagnosis Date Noted  . BPH with obstruction/lower urinary tract symptoms 06/30/2015  . History of urinary retention 06/30/2015  . HLD (hyperlipidemia) 06/28/2014  . Benign essential HTN  06/28/2014  . Degenerative joint disease involving multiple joints 06/28/2014    Past Surgical History:  Procedure Laterality Date  . APPENDECTOMY  1960  . COLONOSCOPY WITH PROPOFOL N/A 11/13/2016   Procedure: COLONOSCOPY WITH PROPOFOL;  Surgeon: Manya Silvas, MD;  Location: [facility] ENDOSCOPY;  Service: Endoscopy;  Laterality: N/A;  . ingiunal hernia repair  2016   1995, 1989  . RHINOPLASTY  1996    Prior to Admission medications   Medication Sig Start Date End Date Taking? Authorizing Provider  aspirin 81 MG chewable tablet Chew by mouth.    [provider]  atorvastatin (LIPITOR) 10 MG tablet Take 10 mg by mouth daily.    [provider]  finasteride (PROSCAR) 5 MG tablet TAKE 1 TABLET BY MOUTH  DAILY 07/16/20   Ernestine Conrad, Hunt Oris, PA-C  lisinopril-hydrochlorothiazide (PRINZIDE,ZESTORETIC) 10-12.5 MG tablet  04/01/16   [provider]  meloxicam (MOBIC) 15 MG tablet Take 15 mg by mouth daily. 05/16/20   [provider]  Multiple Vitamins-Minerals (MULTIVITAMIN ADULT PO) Take by mouth daily.    [provider]  mupirocin ointment (BACTROBAN) 2 % Apply 1 application topically daily. Qd to excision site on back with dressing changes 11/05/20   Ralene Bathe, MD  tamsulosin [facility]) 0.4 MG CAPS capsule TAKE 1 CAPSULE BY MOUTH  DAILY 07/16/20   Zara Council A, PA-C    Allergies Patient has no known allergies.  Family History  Problem Relation Age of Onset  . Cancer  Mother   . Hypertension Mother   . Lung cancer Father   . Prostate cancer Neg Hx   . Kidney cancer Neg Hx   . Bladder Cancer Neg Hx     Social History Social History   Tobacco Use  . Smoking status: Former Research scientist (life sciences)  . Smokeless tobacco: Never Used  . Tobacco comment: quit 40 years ago  Vaping Use  . Vaping Use: Never used  Substance Use Topics  . Alcohol use: Yes    Alcohol/week: 0.0 standard drinks    Comment: occasional  . Drug use: No    Review of  Systems  Review of Systems  Constitutional: Negative for chills and fever.  HENT: Negative for sore throat.   Eyes: Negative for pain.  Respiratory: Negative for cough and stridor.   Cardiovascular: Negative for chest pain.  Gastrointestinal: Negative for vomiting.  Genitourinary: Negative for dysuria.  Musculoskeletal: Negative for myalgias.  Skin: Negative for rash.  Neurological: Positive for tremors, loss of consciousness and headaches. Negative for seizures.  Psychiatric/Behavioral: Negative for suicidal ideas.  All other systems reviewed and are negative.     ____________________________________________   PHYSICAL EXAM:  VITAL SIGNS: ED Triage Vitals  Enc Vitals Group     BP 12/08/20 1544 117/62     Pulse Rate 12/08/20 1544 77     Resp 12/08/20 1544 18     Temp 12/08/20 1544 98.1 F (36.7 C)     Temp Source 12/08/20 1544 Oral     SpO2 12/08/20 1544 98 %     Weight 12/08/20 1545 155 lb (70.3 kg)     Height 12/08/20 1545 5\' 8"  (1.727 m)     Head Circumference --      Peak Flow --      Pain Score 12/08/20 1545 0     Pain Loc --      Pain Edu? --      Excl. in [place]? --    Vitals:   12/08/20 1544  BP: 117/62  Pulse: 77  Resp: 18  Temp: 98.1 F (36.7 C)  SpO2: 98%   Physical Exam Vitals and nursing note reviewed.  Constitutional:      Appearance: He is well-developed and well-nourished.  HENT:     Head: Normocephalic and atraumatic.     Right Ear: External ear normal.     Left Ear: External ear normal.     Nose: Nose normal.     Mouth/Throat:     Mouth: Mucous membranes are moist.  Eyes:     Conjunctiva/sclera: Conjunctivae normal.  Cardiovascular:     Rate and Rhythm: Normal rate and regular rhythm.     Heart sounds: No murmur heard.   Pulmonary:     Effort: Pulmonary effort is normal. No respiratory distress.     Breath sounds: Normal breath sounds.  Abdominal:     Palpations: Abdomen is soft.     Tenderness: There is no abdominal tenderness.   Musculoskeletal:        General: No edema.     Cervical back: Neck supple.     Right lower leg: No edema.     Left lower leg: No edema.  Skin:    General: Skin is warm and dry.     Capillary Refill: Capillary refill takes less than 2 seconds.  Neurological:     Mental Status: He is alert and oriented to person, place, and time.  Psychiatric:        Mood and  Affect: Mood and affect and mood normal.     Very fine bilateral upper extremity tremor.  Cranial nerves II through XII grossly intact.  No pronator drift.  No finger dysmetria.  No step-offs tenderness or deformities over the C/T/L-spine.  There is a very small less than 1 cm abrasion over the right forehead without any other evidence of trauma to the face scalp neck.  Oropharynx is unremarkable.  2+ bilateral radial pulses.  Patient has full strength and sensation throughout all extremities.  There are no effusions tenderness decreased range of motion or other over the bilateral shoulders, elbows, wrists or lower extremity joints. ____________________________________________   LABS (all labs ordered are listed, but only abnormal results are displayed)  Labs Reviewed  BASIC METABOLIC PANEL - Abnormal; Notable for the following components:      Result Value   Glucose, Bld 109 (*)    All other components within normal limits  CBC  CBG MONITORING, ED  TROPONIN I (HIGH SENSITIVITY)  TROPONIN I (HIGH SENSITIVITY)   ____________________________________________  EKG  Sinus rhythm with a ventricular rate of 87, incomplete right bundle branch block, otherwise unremarkable intervals, and diffuse nonspecific ST changes. ____________________________________________  RADIOLOGY  ED MD interpretation: No focal consolidation, large effusion, overt edema, pneumothorax, rib fracture, or widened mediastinum.  No other acute intrathoracic process.  CT head shows no evidence of skull fracture or intracranial hemorrhage.  No other acute  intracranial process.  CT C-spine shows no acute fracture or dislocation.  Official radiology report(s): DG Chest 2 View  Result Date: 12/08/2020 CLINICAL DATA:  Syncope. EXAM: CHEST - 2 VIEW COMPARISON:  None. FINDINGS: The heart size and mediastinal contours are within normal limits. Both lungs are clear. No pneumothorax or pleural effusion is noted. The visualized skeletal structures are unremarkable. IMPRESSION: No active cardiopulmonary disease. Electronically Signed   By: Marijo Conception M.D.   On: 12/08/2020 16:28   CT Head Wo Contrast  Result Date: 12/08/2020 CLINICAL DATA:  Facial trauma. Loss of conscious while ambulating to the restroom striking head. EXAM: CT HEAD WITHOUT CONTRAST TECHNIQUE: Contiguous axial images were obtained from the base of the skull through the vertex without intravenous contrast. COMPARISON:  None. FINDINGS: Brain: No intracranial hemorrhage, mass effect, or midline shift. No hydrocephalus. Incidental cavum septum pellucidum, normal variant anatomy. The basilar cisterns are patent. No evidence of territorial infarct or acute ischemia. No extra-axial or intracranial fluid collection. Vascular: No hyperdense vessel or unexpected calcification. Skull: No fracture or focal lesion. Sinuses/Orbits: No evidence of acute facial bone fracture. Paranasal sinuses and mastoid air cells are clear. Other: None. IMPRESSION: No acute intracranial abnormality. No skull fracture. Electronically Signed   By: Keith Rake M.D.   On: 12/08/2020 19:21   CT Cervical Spine Wo Contrast  Result Date: 12/08/2020 CLINICAL DATA:  Neck trauma. Fall with loss of consciousness while ambulating striking head. EXAM: CT CERVICAL SPINE WITHOUT CONTRAST TECHNIQUE: Multidetector CT imaging of the cervical spine was performed without intravenous contrast. Multiplanar CT image reconstructions were also generated. COMPARISON:  None. FINDINGS: Alignment: Slight broad-based rightward curvature may be  positioning or scoliosis. No traumatic subluxation. No listhesis. Skull base and vertebrae: No acute fracture. Vertebral body heights are maintained. The dens and skull base are intact. Soft tissues and spinal canal: No prevertebral fluid or swelling. No visible canal hematoma. Disc levels: Disc space narrowing and endplate spurring Z6-O2, C5-C6, and C6-C7. There is multilevel facet hypertrophy. Mild canal and left neural foraminal stenosis at  C5-C6 and C6-C7. Upper chest: No acute or unexpected findings. Other: None. IMPRESSION: Degenerative change in the cervical spine without acute fracture or subluxation. Electronically Signed   By: Keith Rake M.D.   On: 12/08/2020 19:25    ____________________________________________   PROCEDURES  Procedure(s) performed (including Critical Care):  .1-3 Lead EKG Interpretation Performed by: Lucrezia Starch, MD Authorized by: Lucrezia Starch, MD     Interpretation: normal     ECG rate assessment: normal     Rhythm: sinus rhythm     Ectopy: none     Conduction: normal       ____________________________________________   INITIAL IMPRESSION / ASSESSMENT AND PLAN / ED COURSE        Patient presents with above to history exam after a syncopal episode and fall that occurred on 10th of referred to the ED by UC for further assessment.  Patient is afebrile hemodynamically stable on arrival.  On exam consolidation of his right upper head but no focal neurological deficits or other abnormalities on exam.  Patient has no other injuries noted on exam and is neurovascular intact in all extremities.  No injuries including intracranial hemorrhage or skull fracture noted on CT head.  CT spine is unremarkable for any other acute injuries.  Very low suspicion for occult visceral injury.  With regard to patient's etiology for syncope:  No arrhythmia identified on ECG.  Patient denies any chest pain given no ischemic findings on ECG with nonelevated  opponent x2 low suspicion for ACS.  No murmurs or other symptoms to suggest acute cardiomyopathy at this time.  Low suspicion for PE as patient is not tachycardic, hypoxic, tachypneic and denies any chest pain or shortness of breath.  CBC shows no evidence of acute anemia is otherwise unremarkable.  BMP shows no significant ultralight or metabolic derangements.  Chest x-ray is unremarkable for evidence of traumatic injury or other acute thoracic process and overall presentation is not consistent with CHF or volume overload.  Is possible patient had a fall secondary to side effect from his Flomax versus some dehydration although given he is not symptomatic at this time orthostatic and otherwise has stable vital signs and reassuring work-up I believe he is safe for discharge with plan for close outpatient PCP follow-up.       ____________________________________________   FINAL CLINICAL IMPRESSION(S) / ED DIAGNOSES  Final diagnoses:  Syncope and collapse  Abrasion of forehead, initial encounter    Medications - No data to display   ED Discharge Orders    None       Note:  This document was prepared using Dragon voice recognition software and may include unintentional dictation errors.   Lucrezia Starch, MD 12/08/20 [phone]

## 2020-12-08 NOTE — ED Notes (Signed)
Transported to Ct scan

## 2020-12-08 NOTE — ED Triage Notes (Signed)
Pt to ED via POV from UC, pt states syncopal episode on Thursday night and his his head, pt with minor abrasion noted to R forehead. Pt states unknown how long he was unconscious. Pt states a lot of emotional stress at this time with recent death in the family. Pt ambulatory at this time, A&O x4, pt c/o continued feeling of "shakiness" since the syncopal epsiode.

## 2020-12-08 NOTE — ED Notes (Signed)
Electronic signature for discharge unavailable, physical copy signed and placed in folder to be scanned into chart. Patient and wife verbalized understanding of discharge instructions.

## 2020-12-08 NOTE — ED Notes (Signed)
Patient is being discharged from the Urgent Care and sent to the [facility] Emergency Department via private vehicle . Per Augusto Gamble, NP, patient is in need of higher level of care due to symptoms. Patient is aware and verbalizes understanding of plan of care.  Vitals:   12/08/20 1422  BP: 135/72  Pulse: 75  Resp: 18  Temp: 98.2 F (36.8 C)  SpO2: 100%

## 2020-12-08 NOTE — ED Provider Notes (Signed)
[facility]    CSN: [id number] Arrival date & time: 12/08/20  1404      History   Chief Complaint Chief Complaint  Patient presents with  . Chills  . Loss of Consciousness  . Head Injury    DOI 12/06/20  . Fatigue    HPI Cole Holt is a 74 y.o. male.   Cole Holt presents with complaints of chills and shakiness. Overnight 12/9  / early morning 12/10, he woke up to use the restroom, lost concsiousness while ambulating, striking his forehead and back of his head on an exercise bike. Unwitnessed. Unknown length of LOC. He [place] of a family member, he had noted feeling chilled there but it was cold. Ever since fall he has noted episodes of shaking and chills. No headache. No vision changes. He has felt fatigued. No nausea or vomiting. No abdominal pain or urinary symptoms. No chest pain or shortness of breath . No cough, sore throat, ear pain or congestion. Denies any previous similar. He has been ambulatory since. He is not on any blood thinning medication. He does take medication for hypertension. History of cystitis, per chart review.    ROS per HPI, negative if not otherwise mentioned.      Past Medical History:  Diagnosis Date  . Acute cystitis with hematuria   . Anxiety   . Arthritis   . Basal cell carcinoma 05/02/2019   right post auricular crease/excision  . BPH (benign prostatic hyperplasia)   . Diverticulitis 2012  . Dysplastic nevus 05/06/2020   Right sup. medial scapula. Severe atypia, close to margin. Excised 06/25/2020, margins free.  Marland Kitchen Hypercholesterolemia   . Hypertension   . Urine retention     Patient Active Problem List   Diagnosis Date Noted  . BPH with obstruction/lower urinary tract symptoms 06/30/2015  . History of urinary retention 06/30/2015  . HLD (hyperlipidemia) 06/28/2014  . Benign essential HTN 06/28/2014  . Degenerative joint disease involving  multiple joints 06/28/2014    Past Surgical History:  Procedure Laterality Date  . APPENDECTOMY  1960  . COLONOSCOPY WITH PROPOFOL N/A 11/13/2016   Procedure: COLONOSCOPY WITH PROPOFOL;  Surgeon: Manya Silvas, MD;  Location: [facility] ENDOSCOPY;  Service: Endoscopy;  Laterality: N/A;  . ingiunal hernia repair  2016   1995, 1989  . RHINOPLASTY  1996       Home Medications    Prior to Admission medications   Medication Sig Start Date End Date Taking? Authorizing Provider  aspirin 81 MG chewable tablet Chew by mouth.   Yes [provider]  atorvastatin (LIPITOR) 10 MG tablet Take 10 mg by mouth daily.   Yes [provider]  finasteride (PROSCAR) 5 MG tablet TAKE 1 TABLET BY MOUTH  DAILY 07/16/20  Yes McGowan, Larene [place] A, PA-C  lisinopril-hydrochlorothiazide (PRINZIDE,ZESTORETIC) 10-12.5 MG tablet  04/01/16  Yes [provider]  meloxicam (MOBIC) 15 MG tablet Take 15 mg by mouth daily. 05/16/20  Yes [provider]  Multiple Vitamins-Minerals (MULTIVITAMIN ADULT PO) Take by mouth daily.   Yes [provider]  tamsulosin (FLOMAX) 0.4 MG CAPS capsule TAKE 1 CAPSULE BY MOUTH  DAILY 07/16/20  Yes McGowan, Larene [place] A, PA-C  mupirocin ointment (BACTROBAN) 2 % Apply 1 application topically daily. Qd to excision site on back with dressing changes 11/05/20   Ralene Bathe, MD    Family History Family History  Problem Relation Age of  Onset  . Cancer Mother   . Hypertension Mother   . Lung cancer Father   . Prostate cancer Neg Hx   . Kidney cancer Neg Hx   . Bladder Cancer Neg Hx     Social History Social History   Tobacco Use  . Smoking status: Former Research scientist (life sciences)  . Smokeless tobacco: Never Used  . Tobacco comment: quit 40 years ago  Vaping Use  . Vaping Use: Never used  Substance Use Topics  . Alcohol use: Yes    Alcohol/week: 0.0 standard drinks    Comment: occasional  . Drug use: No     Allergies   Patient has no known  allergies.   Review of Systems Review of Systems   Physical Exam Triage Vital Signs ED Triage Vitals  Enc Vitals Group     BP 12/08/20 1422 135/72     Pulse Rate 12/08/20 1422 75     Resp 12/08/20 1422 18     Temp 12/08/20 1422 98.2 F (36.8 C)     Temp Source 12/08/20 1422 Oral     SpO2 12/08/20 1422 100 %     Weight 12/08/20 1422 155 lb (70.3 kg)     Height 12/08/20 1422 5\' 8"  (1.727 m)     Head Circumference --      Peak Flow --      Pain Score 12/08/20 1421 0     Pain Loc --      Pain Edu? --      Excl. in [place]? --    No data found.  Updated Vital Signs BP 135/72 (BP Location: Left Arm)   Pulse 75   Temp 98.2 F (36.8 C) (Oral)   Resp 18   Ht 5\' 8"  (1.727 m)   Wt 155 lb (70.3 kg)   SpO2 100%   BMI 23.57 kg/m   Visual Acuity Right Eye Distance:   Left Eye Distance:   Bilateral Distance:    Right Eye Near:   Left Eye Near:    Bilateral Near:     Physical Exam Constitutional:      Appearance: He is well-developed.  HENT:     Head:      Comments: Superficial scratch to forehead without tenderness, hematoma, redness or swelling; no visible hematoma or skin breakdown to site of posterior scalp strike     Mouth/Throat:     Mouth: Mucous membranes are moist.  Eyes:     Extraocular Movements: Extraocular movements intact.     Conjunctiva/sclera: Conjunctivae normal.     Pupils: Pupils are equal, round, and reactive to light.  Cardiovascular:     Rate and Rhythm: Normal rate.  Pulmonary:     Effort: Pulmonary effort is normal.     Breath sounds: Normal breath sounds.  Musculoskeletal:        General: Normal range of motion.     Cervical back: Normal range of motion.  Skin:    General: Skin is warm and dry.  Neurological:     General: No focal deficit present.     Mental Status: He is alert and oriented to person, place, and time.     Cranial Nerves: No cranial nerve deficit.     Sensory: No sensory deficit.     Motor: No weakness.      Coordination: Coordination normal.     Comments: Very fine tremor noted to hands when holding them out; no pronator drift       UC Treatments /  Results  Labs (all labs ordered are listed, but only abnormal results are displayed) Labs Reviewed - No data to display  EKG   Radiology No results found.  Procedures Procedures (including critical care time)  Medications Ordered in UC Medications - No data to display  Initial Impression / Assessment and Plan / UC Course  I have reviewed the triage vital signs and the nursing notes.  Pertinent labs & imaging results that were available during my care of the patient were reviewed by me and considered in my medical decision making (see chart for details).     Syncopal episode with LOC and head injury two nights ago. Chills, fine tremor ever since. Endorses significant stress this past week related to family member death. History of cystitis, this could be source of symptoms potentially, however, given age >41 and head injury, feel that he needs more thorough evaluation to ensure there is not any more serious etiology ie sepsis, intracranial injury. Patient wife will transport him to ER now. Patient verbalized understanding and agreeable to plan.   Final Clinical Impressions(s) / UC Diagnoses   Final diagnoses:  Shakiness  Fatigue, unspecified type  Chills  Head injury with loss of consciousness [facility])     Discharge Instructions     I do recommend that you seek further evaluation in the emergency department, now, to further evaluate the chills/shakiness you are experiencing fallowing your fall and head injury.     ED Prescriptions    None     PDMP not reviewed this encounter.   Zigmund Gottron, NP 12/08/20 1500

## 2021-06-17 ENCOUNTER — Other Ambulatory Visit: Payer: Self-pay | Admitting: Urology

## 2021-06-17 DIAGNOSIS — N138 Other obstructive and reflux uropathy: Secondary | ICD-10-CM

## 2021-07-15 ENCOUNTER — Other Ambulatory Visit: Payer: Self-pay | Admitting: Family Medicine

## 2021-07-15 DIAGNOSIS — N401 Enlarged prostate with lower urinary tract symptoms: Secondary | ICD-10-CM

## 2021-07-15 DIAGNOSIS — N138 Other obstructive and reflux uropathy: Secondary | ICD-10-CM

## 2021-07-16 ENCOUNTER — Other Ambulatory Visit: Payer: Self-pay

## 2021-07-16 ENCOUNTER — Other Ambulatory Visit: Payer: Medicare Other

## 2021-07-16 DIAGNOSIS — N401 Enlarged prostate with lower urinary tract symptoms: Secondary | ICD-10-CM

## 2021-07-16 DIAGNOSIS — N138 Other obstructive and reflux uropathy: Secondary | ICD-10-CM

## 2021-07-17 LAB — PSA: Prostate Specific Ag, Serum: 3.4 ng/mL (ref 0.0–4.0)

## 2021-07-23 NOTE — Progress Notes (Signed)
2:12 PM   Cole Holt 1946-06-14 [id number]  Referring provider: Sofie Hartigan, MD [place],  [place] [address]  Urological history: 1. BPH with LU TS -PSA 3.4 (6.8) in 06/2021 -I PSS 10/2 -managed with tamsulosin 0.4 mg daily and finasteride 5 mg daily  2. ED -contributing factors of age, BPH, HTN, HLD, former smoker and anxiety -not interested in therapy  Chief Complaint  Patient presents with   Benign Prostatic Hypertrophy     HPI: Cole Holt is a 75 y.o. male who presents today for yearly appointment.    He has no new urinary complaints.  He continues to have frequency which is his baseline.  Patient denies any modifying or aggravating factors.  Patient denies any gross hematuria, dysuria or suprapubic/flank pain.  Patient denies any fevers, chills, nausea or vomiting.    He has not missed any finasteride doses.       IPSS     Row Name 07/24/21 1300         International Prostate Symptom Score   How often have you had the sensation of not emptying your bladder? Less than 1 in 5     How often have you had to urinate less than every two hours? Less than half the time     How often have you found you stopped and started again several times when you urinated? Less than 1 in 5 times     How often have you found it difficult to postpone urination? Less than 1 in 5 times     How often have you had a weak urinary stream? Less than 1 in 5 times     How often have you had to strain to start urination? Less than 1 in 5 times     How many times did you typically get up at night to urinate? 3 Times     Total IPSS Score 10               Score:  1-7 Mild 8-19 Moderate 20-35 Severe    PMH: Past Medical History:  Diagnosis Date   Acute cystitis with hematuria    Anxiety    Arthritis    Basal cell carcinoma 05/02/2019   right post auricular crease/excision   BPH (benign prostatic hyperplasia)    Diverticulitis 2012    Dysplastic nevus 05/06/2020   Right sup. medial scapula. Severe atypia, close to margin. Excised 06/25/2020, margins free.   Hypercholesterolemia    Hypertension    Urine retention     Surgical History: Past Surgical History:  Procedure Laterality Date   APPENDECTOMY  1960   COLONOSCOPY WITH PROPOFOL N/A 11/13/2016   Procedure: COLONOSCOPY WITH PROPOFOL;  Surgeon: Manya Silvas, MD;  Location: [facility] ENDOSCOPY;  Service: Endoscopy;  Laterality: N/A;   ingiunal hernia repair  2016   1995, [place] Medications:  Allergies as of 07/24/2021   No Known Allergies      Medication List        Accurate as of July 24, 2021  2:12 PM. If you have any questions, ask your nurse or doctor.          STOP taking these medications    mupirocin ointment 2 % Commonly known as: BACTROBAN Stopped by: Zara Council, PA-C       TAKE these medications    aspirin 81 MG chewable tablet Chew by mouth.  atorvastatin 10 MG tablet Commonly known as: LIPITOR Take 10 mg by mouth daily.   finasteride 5 MG tablet Commonly known as: PROSCAR TAKE 1 TABLET BY MOUTH  DAILY   lisinopril-hydrochlorothiazide 10-12.5 MG tablet Commonly known as: ZESTORETIC   meloxicam 15 MG tablet Commonly known as: MOBIC Take 15 mg by mouth daily.   MULTIVITAMIN ADULT PO Take by mouth daily.   tamsulosin 0.4 MG Caps capsule Commonly known as: FLOMAX TAKE 1 CAPSULE BY MOUTH  DAILY        Allergies: No Known Allergies  Family History: Family History  Problem Relation Age of Onset   Cancer Mother    Hypertension Mother    Lung cancer Father    Prostate cancer Neg Hx    Kidney cancer Neg Hx    Bladder Cancer Neg Hx     Social History:  reports that he has quit smoking. He has never used smokeless tobacco. He reports current alcohol use. He reports that he does not use drugs.  ROS: For pertinent review of systems please refer to history of present  illness  Physical Exam: BP 126/68   Pulse 71   Ht '5\' 8"'  (1.727 m)   Wt 158 lb (71.7 kg)   BMI 24.02 kg/m   Constitutional:  Well nourished. Alert and oriented, No acute distress. HEENT: [place] AT, moist mucus membranes.  Trachea midline Cardiovascular: No clubbing, cyanosis, or edema. Respiratory: Normal respiratory effort, no increased work of breathing. GI: Abdomen is soft, non tender, non distended, no abdominal masses. Liver and spleen not palpable.  No hernias appreciated.  Stool sample for occult testing is not indicated.   GU: No CVA tenderness.  No bladder fullness or masses.  Patient with circumcised phallus.  Urethral meatus is patent.  No penile discharge. No penile lesions or rashes. Scrotum without lesions, cysts, rashes and/or edema.  Testicles are located scrotally bilaterally. No masses are appreciated in the testicles. Left and right epididymis are normal. Rectal: Patient with  normal sphincter tone. Anus and perineum without scarring or rashes. No rectal masses are appreciated. Prostate is approximately 50 grams, no nodules are appreciated. Seminal vesicles could not be palpitated.  Neurologic: Grossly intact, no focal deficits, moving all 4 extremities. Psychiatric: Normal mood and affect.   Laboratory Data: Thyroid Stimulating Hormone (TSH) 0.450-5.330 uIU/ml uIU/mL 0.641   Resulting Agency  [facility] - LAB  Specimen Collected: 04/01/21 10:34 Last Resulted: 04/01/21 16:18  Received From: [place] [facility]  Result Received: 06/02/21 14:18   Hemoglobin A1C 4.2 - 5.6 % 5.4   Average Blood Glucose (Calc) mg/dL 108   Resulting [facility] - LAB   Narrative Performed by [facility] - LAB Normal Range:    4.2 - 5.6%  Increased Risk:  5.7 - 6.4%  Diabetes:        >= 6.5%  Glycemic Control for adults with diabetes:  <7%   Specimen Collected: 04/01/21 10:34 Last Resulted: 04/01/21 13:16  Received From: [place]  Result Received: 06/02/21 14:18   Cholesterol, Total 100 - 200 mg/dL 170   Triglyceride 35 - 199 mg/dL 101   HDL (High Density Lipoprotein) Cholesterol 29.0 - 71.0 mg/dL 65.2   LDL Calculated 0 - 130 mg/dL 85   VLDL Cholesterol mg/dL 20   Cholesterol/HDL Ratio  2.6   Resulting Agency  [facility] - LAB  Specimen Collected: 04/01/21 10:34 Last Resulted: 04/01/21 16:37  Received From: [facility]  Result Received: 06/02/21 14:18   Glucose 70 - 110 mg/dL 90   Sodium 136 - 145 mmol/L 138   Potassium 3.6 - 5.1 mmol/L 4.3   Chloride 97 - 109 mmol/L 101   Carbon Dioxide (CO2) 22.0 - 32.0 mmol/L 34.4 High    Urea Nitrogen (BUN) 7 - 25 mg/dL 11   Creatinine 0.7 - 1.3 mg/dL 0.7   Glomerular Filtration Rate (eGFR), MDRD Estimate >60 mL/min/1.73sq m 111   Calcium 8.7 - 10.3 mg/dL 9.7   AST  8 - 39 U/L 22   ALT  6 - 57 U/L 23   Alk Phos (alkaline Phosphatase) 34 - 104 U/L 52   Albumin 3.5 - 4.8 g/dL 4.6   Bilirubin, Total 0.3 - 1.2 mg/dL 0.9   Protein, Total 6.1 - 7.9 g/dL 6.4   A/G Ratio 1.0 - 5.0 gm/dL 2.6   Resulting Agency  [facility] - LAB  Specimen Collected: 04/01/21 10:34 Last Resulted: 04/01/21 16:37  Received From: [place] [facility]  Result Received: 06/02/21 14:18  I have reviewed the labs  Assessment & Plan:    1. Increase in PSA velocity -discussed that this may be due to laboratory error and that it would be reasonable to repeat the PSA in one month for confirmation -if PSA continues to remain at a 3 or higher, it is concerning for prostate cancer and we could with schedule a prostate biopsy or schedule a prostate MRI for further stratification with the understanding that the MRI does not allow for a tissue diagnosis and may not diagnosis a prostate cancer 20% of the time -he would like to have his PSA rechecked in one month and if it remains at 3 or higher he would like a prostate MRI prior to biopsy  2. BPH with  LUTS -continue tamsulosin 0.4 mg daily and finasteride 5 mg daily  Return in about 1 month (around 08/24/2021) for PSA only .  [place] [place], [place] [address] [phone] Zara Council, PA-C

## 2021-07-24 ENCOUNTER — Encounter: Payer: Self-pay | Admitting: Urology

## 2021-07-24 ENCOUNTER — Ambulatory Visit: Payer: Medicare Other | Admitting: Urology

## 2021-07-24 ENCOUNTER — Other Ambulatory Visit: Payer: Self-pay

## 2021-07-24 VITALS — BP 126/68 | HR 71 | Ht 68.0 in | Wt 158.0 lb

## 2021-07-24 DIAGNOSIS — N401 Enlarged prostate with lower urinary tract symptoms: Secondary | ICD-10-CM

## 2021-07-24 DIAGNOSIS — R972 Elevated prostate specific antigen [PSA]: Secondary | ICD-10-CM | POA: Diagnosis not present

## 2021-07-24 DIAGNOSIS — N138 Other obstructive and reflux uropathy: Secondary | ICD-10-CM

## 2021-08-26 ENCOUNTER — Other Ambulatory Visit: Payer: Medicare Other

## 2021-08-26 ENCOUNTER — Other Ambulatory Visit: Payer: Self-pay

## 2021-08-26 DIAGNOSIS — R972 Elevated prostate specific antigen [PSA]: Secondary | ICD-10-CM

## 2021-08-27 LAB — PSA: Prostate Specific Ag, Serum: 1 ng/mL (ref 0.0–4.0)

## 2021-10-30 ENCOUNTER — Ambulatory Visit
Admission: EM | Admit: 2021-10-30 | Discharge: 2021-10-30 | Disposition: A | Discharge status: Home / Self Care | Payer: Medicare Other | Attending: Physician Assistant | Admitting: Physician Assistant

## 2021-10-30 ENCOUNTER — Encounter: Payer: Self-pay | Admitting: Emergency Medicine

## 2021-10-30 ENCOUNTER — Other Ambulatory Visit: Payer: Self-pay

## 2021-10-30 DIAGNOSIS — R109 Unspecified abdominal pain: Secondary | ICD-10-CM | POA: Diagnosis present

## 2021-10-30 DIAGNOSIS — K2900 Acute gastritis without bleeding: Secondary | ICD-10-CM

## 2021-10-30 DIAGNOSIS — R140 Abdominal distension (gaseous): Secondary | ICD-10-CM

## 2021-10-30 LAB — COMPREHENSIVE METABOLIC PANEL
ALT: 27 U/L (ref 0–44)
AST: 37 U/L (ref 15–41)
Albumin: 4.4 g/dL (ref 3.5–5.0)
Alkaline Phosphatase: 49 U/L (ref 38–126)
Anion gap: 9 (ref 5–15)
BUN: 12 mg/dL (ref 8–23)
CO2: 26 mmol/L (ref 22–32)
Calcium: 9.1 mg/dL (ref 8.9–10.3)
Chloride: 96 mmol/L — ABNORMAL LOW (ref 98–111)
Creatinine, Ser: 0.8 mg/dL (ref 0.61–1.24)
GFR, Estimated: >60 mL/min (ref >60)
Glucose, Bld: 89 mg/dL (ref 70–99)
Potassium: 3.6 mmol/L (ref 3.5–5.1)
Sodium: 131 mmol/L — ABNORMAL LOW (ref 135–145)
Total Bilirubin: 0.9 mg/dL (ref 0.3–1.2)
Total Protein: 7.1 g/dL (ref 6.5–8.1)

## 2021-10-30 LAB — CBC WITH DIFFERENTIAL/PLATELET
Abs Immature Granulocytes: 0.01 10*3/uL (ref 0.00–0.07)
Basophils Absolute: 0 10*3/uL (ref 0.0–0.1)
Basophils Relative: 1 %
Eosinophils Absolute: 0 10*3/uL (ref 0.0–0.5)
Eosinophils Relative: 1 %
HCT: 37.9 % — ABNORMAL LOW (ref 39.0–52.0)
Hemoglobin: 12.9 g/dL — ABNORMAL LOW (ref 13.0–17.0)
Immature Granulocytes: 0 %
Lymphocytes Relative: 37 %
Lymphs Abs: 1.6 10*3/uL (ref 0.7–4.0)
MCH: 33.1 pg (ref 26.0–34.0)
MCHC: 34 g/dL (ref 30.0–36.0)
MCV: 97.2 fL (ref 80.0–100.0)
Monocytes Absolute: 0.5 10*3/uL (ref 0.1–1.0)
Monocytes Relative: 10 %
Neutro Abs: 2.2 10*3/uL (ref 1.7–7.7)
Neutrophils Relative %: 51 %
Platelets: 194 10*3/uL (ref 150–400)
RBC: 3.9 MIL/uL — ABNORMAL LOW (ref 4.22–5.81)
RDW: 12.2 % (ref 11.5–15.5)
WBC: 4.4 10*3/uL (ref 4.0–10.5)
nRBC: 0 % (ref 0.0–0.2)

## 2021-10-30 LAB — URINALYSIS, COMPLETE (UACMP) WITH MICROSCOPIC
Bacteria, UA: NONE SEEN
Bilirubin Urine: NEGATIVE
Glucose, UA: NEGATIVE mg/dL
Hgb urine dipstick: NEGATIVE
Ketones, ur: 40 mg/dL — AB
Leukocytes,Ua: NEGATIVE
Nitrite: NEGATIVE
Protein, ur: NEGATIVE mg/dL
Specific Gravity, Urine: 1.01 (ref 1.005–1.030)
pH: 6.5 (ref 5.0–8.0)

## 2021-10-30 MED ORDER — PANTOPRAZOLE SODIUM 20 MG PO TBEC: 20.0000 mg | DELAYED_RELEASE_TABLET | Freq: Every day | ORAL | 0 refills | Status: AC

## 2021-10-30 MED ORDER — LIDOCAINE VISCOUS HCL 2 % MT SOLN
15.0000 mL | Freq: Once | OROMUCOSAL | Status: AC
  Administered 2021-10-30: 15 mL via ORAL

## 2021-10-30 MED ORDER — ALUM & MAG HYDROXIDE-SIMETH 200-200-20 MG/5ML PO SUSP
30.0000 mL | Freq: Once | ORAL | Status: AC
  Administered 2021-10-30: 30 mL via ORAL

## 2021-10-30 NOTE — ED Triage Notes (Signed)
Pt presents today with c/o of abdominal cramping and bloating x 10 days. Denies n/v/d. Denies dysuria. Denies fever.

## 2021-10-30 NOTE — ED Provider Notes (Signed)
[facility]    CSN: [id number] Arrival date & time: 10/30/21  1259      History   Chief Complaint Chief Complaint  Patient presents with   Bloated   Abdominal Cramping    HPI Cole Holt is a 75 y.o. male presenting for approximately 1.5-week history of abdominal bloating and cramping.  This is associated with increased belching and flatulence.  Symptoms are worse after eating.  Pain is also worse when he lays down at night.  He says he avoids certain foods that cause him discomfort.  He says he does not really eat any dairy.  Patient denies any associated nausea/vomiting/diarrhea, constipation, black or bloody stools.  Patient denies any dysuria, urinary frequency urgency or urinary retention.  No flank pain or hematuria.  Patient reports he has an appointment with his PCP in a week but feels like his symptoms have gotten worse and wanted to be seen sooner.  He has tried over-the-counter Gas-X and Beano as well as Tums.  He says the Gas-X seems to help the most.  Patient does have history of GERD, BPH, hypertension and hyperlipidemia.  Last colonoscopy was in 2017.  He had polyps.  Patient reports he believes he has had diverticulitis in the past.  No other complaints.  HPI  Past Medical History:  Diagnosis Date   Acute cystitis with hematuria    Anxiety    Arthritis    Basal cell carcinoma 05/02/2019   right post auricular crease/excision   BPH (benign prostatic hyperplasia)    Diverticulitis 2012   Dysplastic nevus 05/06/2020   Right sup. medial scapula. Severe atypia, close to margin. Excised 06/25/2020, margins free.   Hypercholesterolemia    Hypertension    Urine retention     Patient Active Problem List   Diagnosis Date Noted   BPH with obstruction/lower urinary tract symptoms 06/30/2015   History of urinary retention 06/30/2015   HLD (hyperlipidemia) 06/28/2014   Benign essential HTN 06/28/2014   Degenerative joint disease involving multiple joints  06/28/2014    Past Surgical History:  Procedure Laterality Date   APPENDECTOMY  1960   COLONOSCOPY WITH PROPOFOL N/A 11/13/2016   Procedure: COLONOSCOPY WITH PROPOFOL;  Surgeon: Manya Silvas, MD;  Location: [facility];  Service: Endoscopy;  Laterality: N/A;   ingiunal hernia repair  2016   1995, [place] Medications    Prior to Admission medications   Medication Sig Start Date End Date Taking? Authorizing Provider  pantoprazole (PROTONIX) 20 MG tablet Take 1 tablet (20 mg total) by mouth daily for 15 days. 10/30/21 11/14/21 Yes Danton Clap, PA-C  aspirin 81 MG chewable tablet Chew by mouth.    [provider]  atorvastatin (LIPITOR) 10 MG tablet Take 10 mg by mouth daily.    [provider]  finasteride (PROSCAR) 5 MG tablet TAKE 1 TABLET BY MOUTH  DAILY 06/18/21   Ernestine Conrad, Hunt Oris, PA-C  lisinopril-hydrochlorothiazide (PRINZIDE,ZESTORETIC) 10-12.5 MG tablet  04/01/16   [provider]  meloxicam (MOBIC) 15 MG tablet Take 15 mg by mouth daily. 05/16/20   [provider]  Multiple Vitamins-Minerals (MULTIVITAMIN ADULT PO) Take by mouth daily.    [provider]  tamsulosin (FLOMAX) 0.4 MG CAPS capsule TAKE 1 CAPSULE BY MOUTH  DAILY 06/18/21   Ernestine Conrad Hunt Oris, PA-C    Family History Family History  Problem Relation Age of Onset   Cancer Mother  Hypertension Mother    Lung cancer Father    Prostate cancer Neg Hx    Kidney cancer Neg Hx    Bladder Cancer Neg Hx     Social History Social History   Tobacco Use   Smoking status: Former   Smokeless tobacco: Never   Tobacco comments:    quit 40 years ago  Vaping Use   Vaping Use: Never used  Substance Use Topics   Alcohol use: Yes    Alcohol/week: 0.0 standard drinks    Comment: occasional   Drug use: No     Allergies   Patient has no known allergies.   Review of Systems Review of Systems  Constitutional:  Negative for fatigue  and fever.  Respiratory:  Negative for shortness of breath.   Cardiovascular:  Negative for chest pain.  Gastrointestinal:  Positive for abdominal distention and abdominal pain. Negative for anal bleeding, blood in stool, constipation, diarrhea, nausea, rectal pain and vomiting.  Genitourinary:  Negative for decreased urine volume, difficulty urinating, dysuria, flank pain, frequency, hematuria and testicular pain.  Musculoskeletal:  Negative for back pain.  Neurological:  Negative for weakness.    Physical Exam Triage Vital Signs ED Triage Vitals  Enc Vitals Group     BP      Pulse      Resp      Temp      Temp src      SpO2      Weight      Height      Head Circumference      Peak Flow      Pain Score      Pain Loc      Pain Edu?      Excl. in [place]?    No data found.  Updated Vital Signs BP 129/75 (BP Location: Right Arm)   Pulse 71   Temp 98.3 F (36.8 C) (Oral)   Resp 18   SpO2 99%     Physical Exam Vitals and nursing note reviewed.  Constitutional:      General: He is not in acute distress.    Appearance: Normal appearance. He is well-developed. He is not ill-appearing.  HENT:     Head: Normocephalic and atraumatic.  Eyes:     General: No scleral icterus.    Conjunctiva/sclera: Conjunctivae normal.  Cardiovascular:     Rate and Rhythm: Normal rate and regular rhythm.     Heart sounds: Normal heart sounds.  Pulmonary:     Effort: Pulmonary effort is normal. No respiratory distress.     Breath sounds: Normal breath sounds.  Abdominal:     Palpations: Abdomen is soft.     Tenderness: There is abdominal tenderness (LUQ, epigastric, periumbilical). There is no right CVA tenderness or left CVA tenderness.  Musculoskeletal:     Cervical back: Neck supple.  Skin:    General: Skin is warm and dry.  Neurological:     Mental Status: He is alert.     Motor: No weakness.     Coordination: Coordination normal.     Gait: Gait normal.  Psychiatric:        Mood  and Affect: Mood normal.        Behavior: Behavior normal.        Thought Content: Thought content normal.     UC Treatments / Results  Labs (all labs ordered are listed, but only abnormal results are displayed) Labs Reviewed  URINALYSIS, COMPLETE (UACMP) WITH MICROSCOPIC -  Abnormal; Notable for the following components:      Result Value   Ketones, ur 40 (*)    All other components within normal limits  COMPREHENSIVE METABOLIC PANEL - Abnormal; Notable for the following components:   Sodium 131 (*)    Chloride 96 (*)    All other components within normal limits  CBC WITH DIFFERENTIAL/PLATELET - Abnormal; Notable for the following components:   RBC 3.90 (*)    Hemoglobin 12.9 (*)    HCT 37.9 (*)    All other components within normal limits    EKG   Radiology No results found.  Procedures Procedures (including critical care time)  Medications Ordered in UC Medications  alum & mag hydroxide-simeth (MAALOX/MYLANTA) 200-200-20 MG/5ML suspension 30 mL (30 mLs Oral Given 10/30/21 1539)    And  lidocaine (XYLOCAINE) 2 % viscous mouth solution 15 mL (15 mLs Oral Given 10/30/21 1538)    Initial Impression / Assessment and Plan / UC Course  I have reviewed the triage vital signs and the nursing notes.  Pertinent labs & imaging results that were available during my care of the patient were reviewed by me and considered in my medical decision making (see chart for details).  75 year old male presenting for 10-day history of abdominal bloating and cramping.  This is associated with increased belching and burping.  Denies any fever, constipation, diarrhea, black or bloody stools.  No urinary symptoms.  Vitals all normal and stable and he is overall well-appearing.  He does have tenderness palpation of the left upper quadrant, epigastric region and periumbilical region.  Normal bowel sounds.  No guarding or rebound.  No obvious distention.  Abdomen is soft.  Patient given GI cocktail  in clinic.  He does state that this has already helped his symptoms.  CBC and CMP obtained.  Advised patient if white blood cell count is elevated we can consider getting CT to rule out diverticulitis but it is unlikely and symptoms most likely due to gastritis.  Patient agrees to plan.  Normal WBCs.  Hemoglobin slightly decreased at 12.9.  Sodium slightly decreased at 131.  Reviewed all lab results with patient.  He does have an appointment with PCP next week.  Advised him to discuss his results with PCP and asked to have them rechecked.  Patient reports feeling much better with the GI cocktail also advised him to try Maalox/Mylanta over-the-counter.  Have also sent in pantoprazole.  He can take the Gas-X if absolutely needed as well.  Reviewed foods to avoid.  Advised to keep appointment PCP next week.  Reviewed return and ED precautions.  Suspect symptoms are due to gastritis.   Final Clinical Impressions(s) / UC Diagnoses   Final diagnoses:  Acute gastritis without hemorrhage, unspecified gastritis type  Abdominal bloating  Abdominal cramping     Discharge Instructions      -Your labs look good.  Your white blood cell count is normal.  Your sodium is a little bit decreased so I would speak with your primary doctor about this next week so that they can recheck it. -Symptoms are likely due to gastritis/GERD.  See handout on food choices for GERD. - We gave you Maalox/Mylanta in clinic.  If you believe this helped you can take it.  It is over-the-counter.  I have sent pantoprazole or Protonix to the pharmacy.  It is a strong antacid.  I think this will definitely help you as well. - If you feel like your abdominal pain gets  a lot worse before you can see your PCP you should return or go to emergency department.     ED Prescriptions     Medication Sig Dispense Auth. Provider   pantoprazole (PROTONIX) 20 MG tablet Take 1 tablet (20 mg total) by mouth daily for 15 days. 15 tablet Gretta Cool      PDMP not reviewed this encounter.   Danton Clap, PA-C 10/30/21 1646

## 2021-10-30 NOTE — Discharge Instructions (Addendum)
-  Your labs look good.  Your white blood cell count is normal.  Your sodium is a little bit decreased so I would speak with your primary doctor about this next week so that they can recheck it. -Symptoms are likely due to gastritis/GERD.  See handout on food choices for GERD. - We gave you Maalox/Mylanta in clinic.  If you believe this helped you can take it.  It is over-the-counter.  I have sent pantoprazole or Protonix to the pharmacy.  It is a strong antacid.  I think this will definitely help you as well. - If you feel like your abdominal pain gets a lot worse before you can see your PCP you should return or go to emergency department.

## 2021-11-01 IMAGING — CR DG CHEST 2V
2 series · 2 of 2 positions shown · non-contrast
Comparison: None.

CLINICAL DATA: Syncope.

EXAM:
CHEST - 2 VIEW

[chest pa]
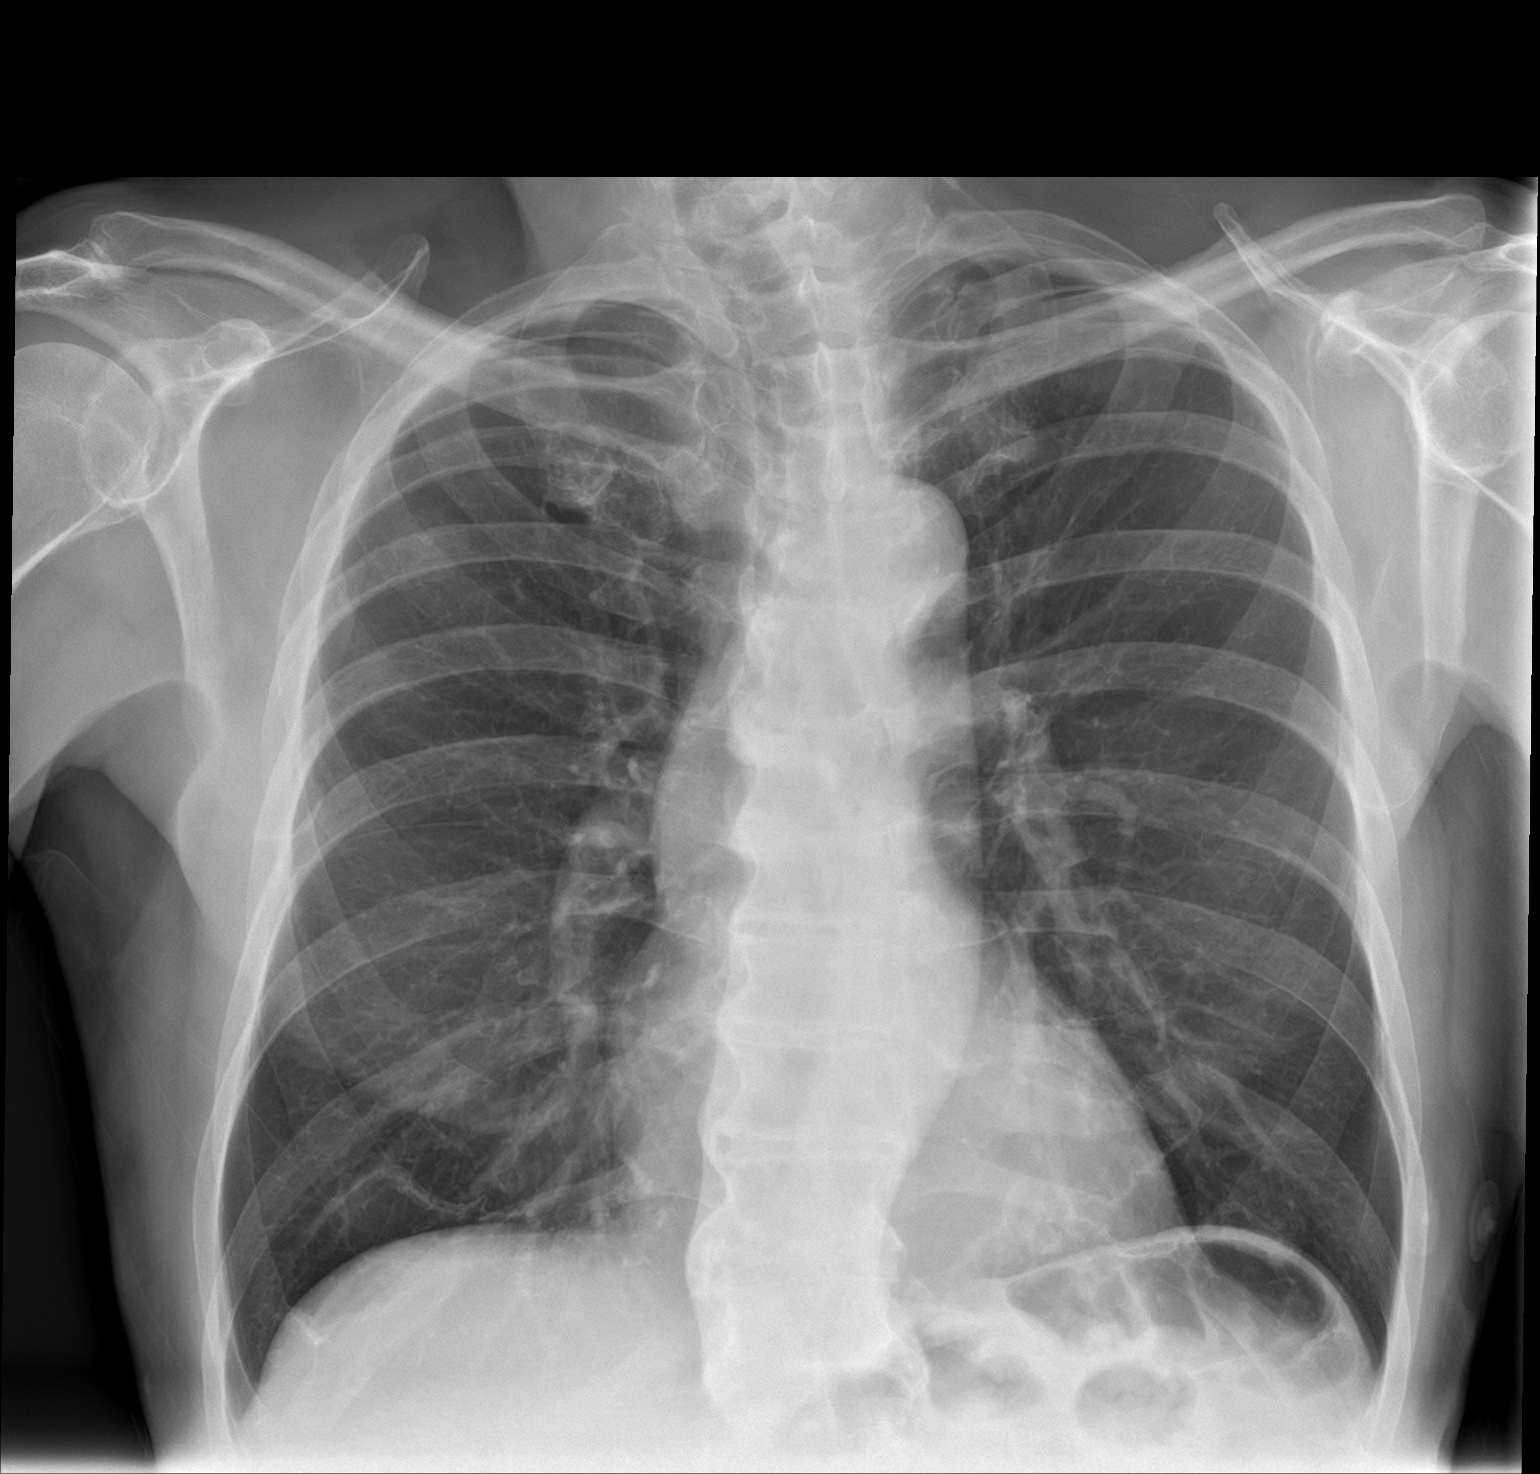

[chest lat]
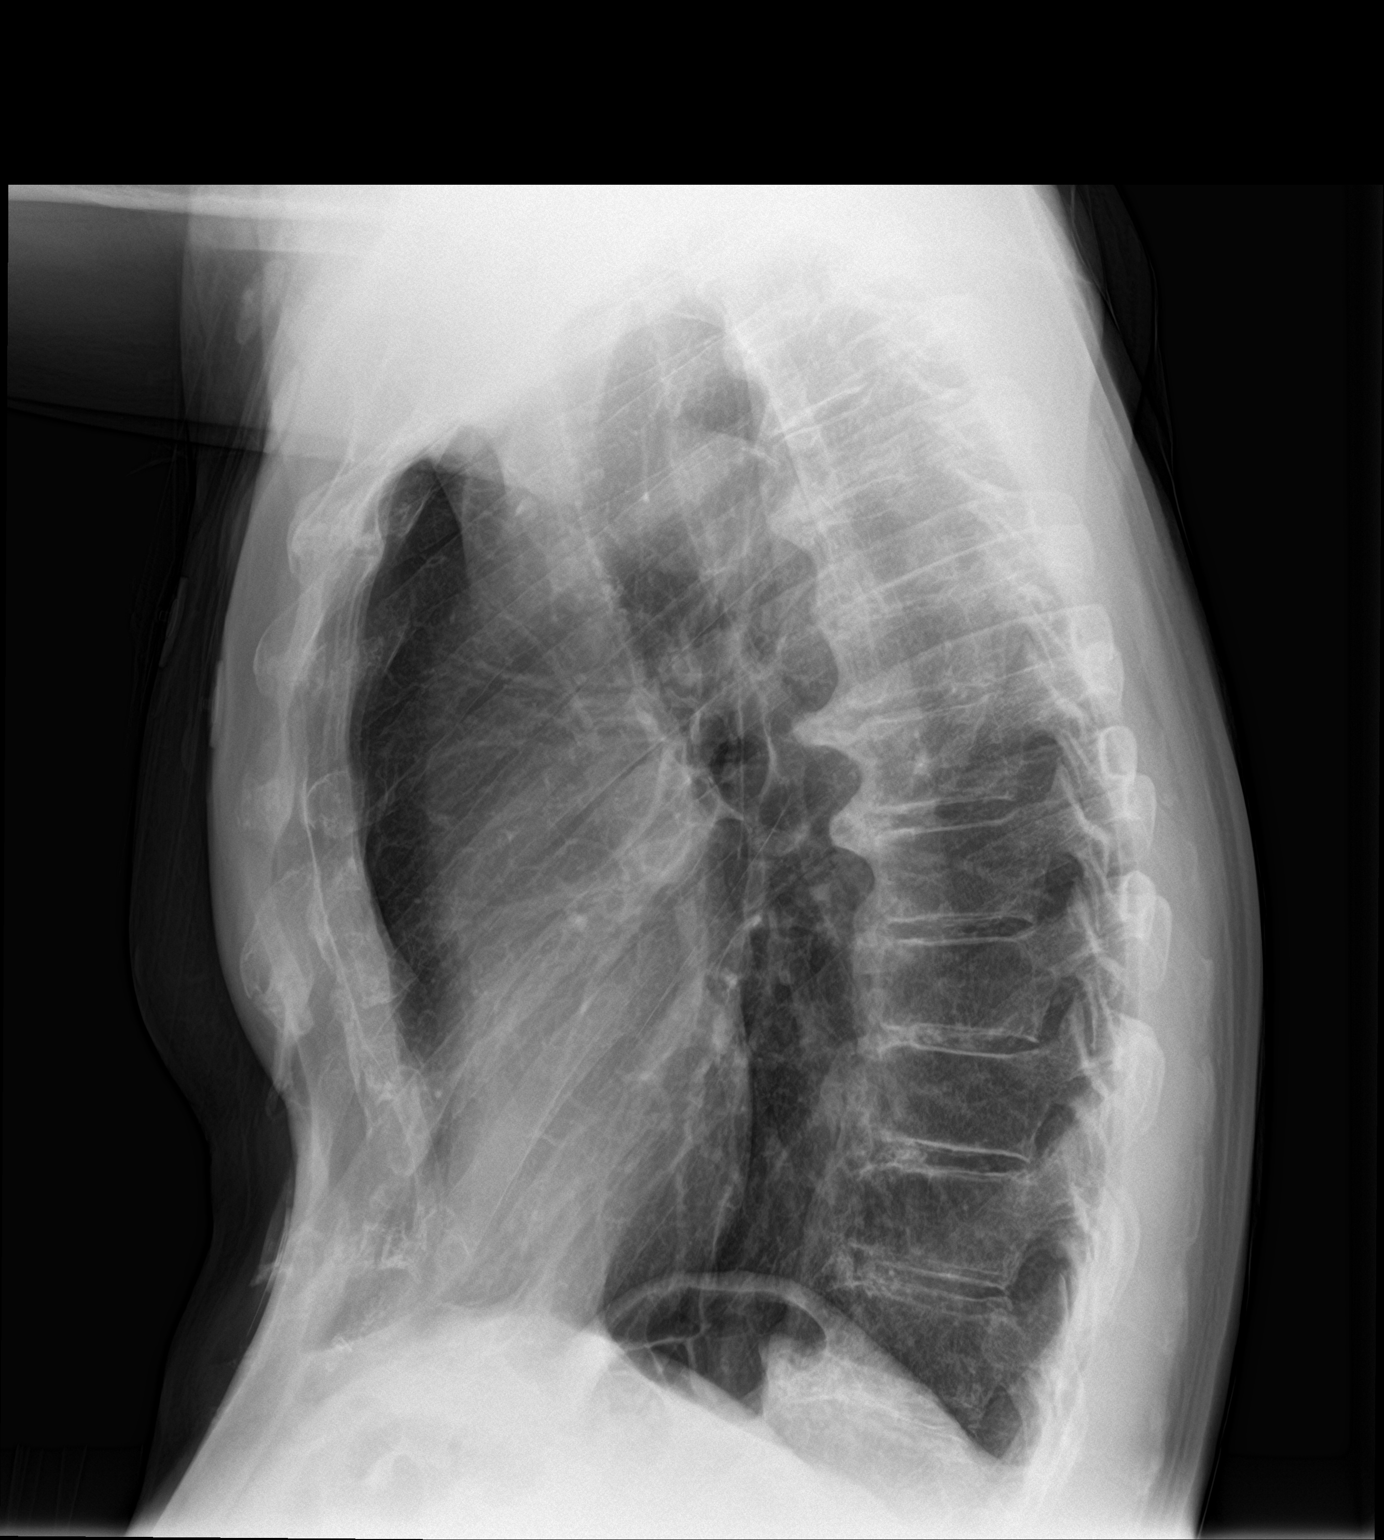

[2 of 2 positions shown; findings below may reference images not displayed]

FINDINGS: The heart size and mediastinal contours are within normal limits.
Both lungs are clear. No pneumothorax or pleural effusion is noted.
The visualized skeletal structures are unremarkable.
IMPRESSION: No active cardiopulmonary disease.

## 2021-11-01 IMAGING — CT CT HEAD W/O CM
3 series · 14 of 47 positions shown, 16 images · non-contrast
Comparison: None.

CLINICAL DATA: Facial trauma. Loss of conscious while ambulating to
the restroom striking head.

EXAM:
CT HEAD WITHOUT CONTRAST
TECHNIQUE: Contiguous axial images were obtained from the base of the skull
through the vertex without intravenous contrast.

[Series 2: head wo · axial · 0.45mm/px · z∈[+53,+193]mm · 8 of 34 slices shown, 10 images]
[im 3/34  brain]
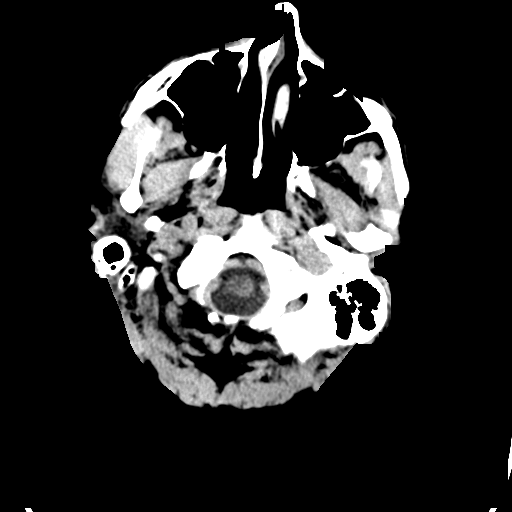
[im 3/34  bone]
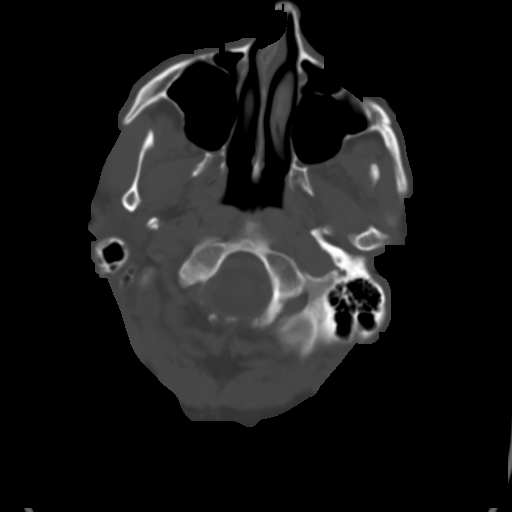
[im 7/34  brain]
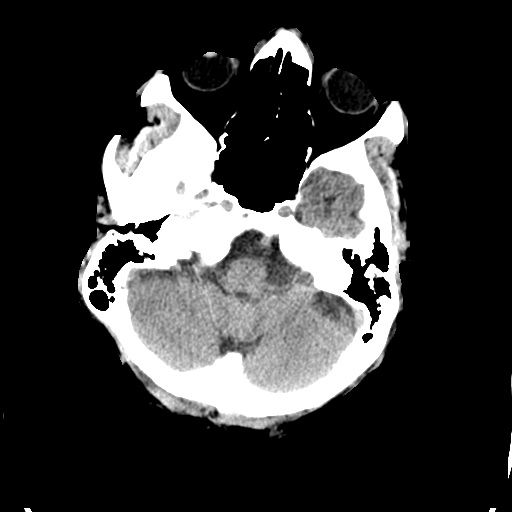
[im 11/34  brain]
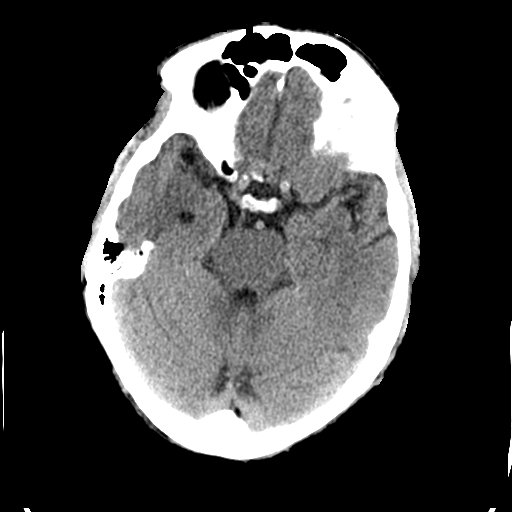
[im 15/34  brain]
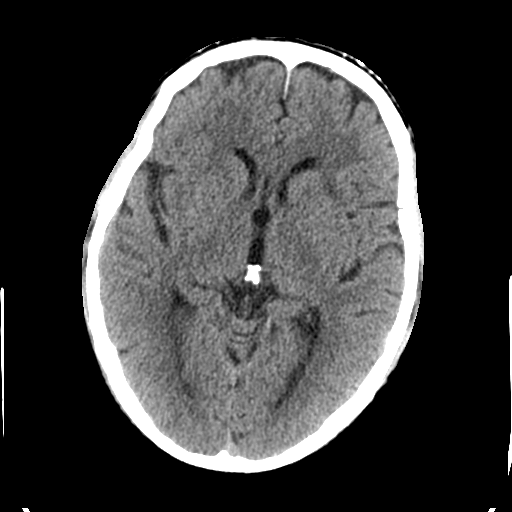
[im 19/34  brain]
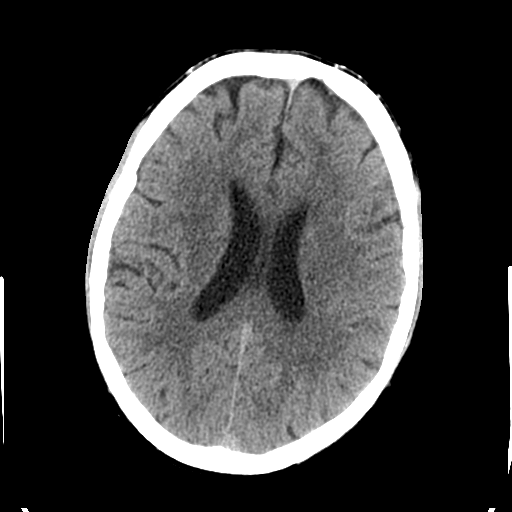
[im 19/34  bone]
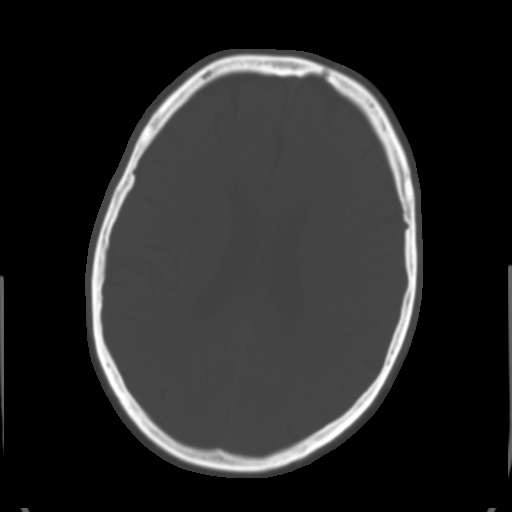
[im 23/34  brain]
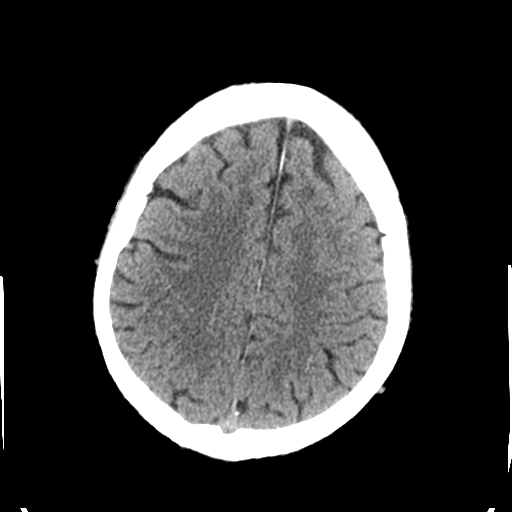
[im 27/34  brain]
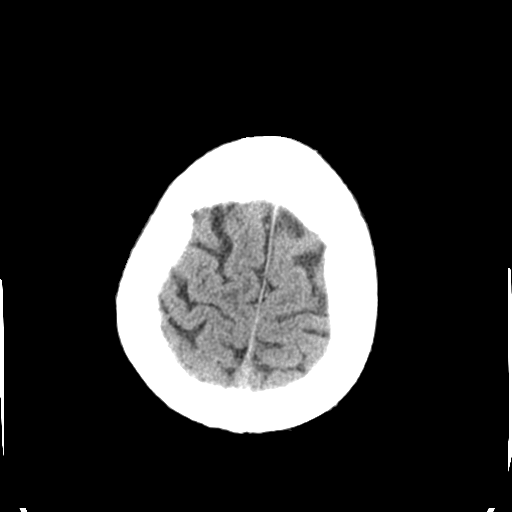
[im 31/34  brain]
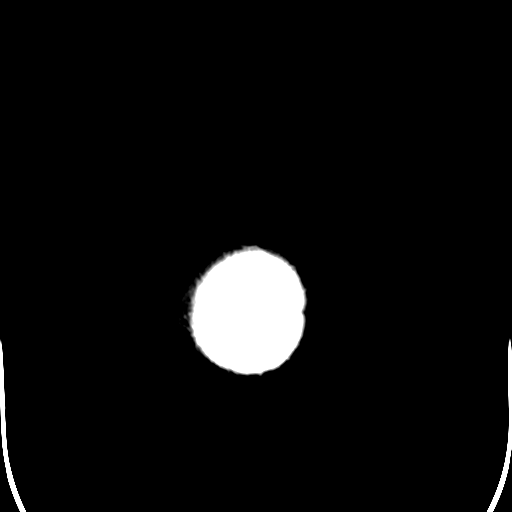

[Series 4: coronal soft tissue · coronal · 0.32mm/px · 3 of 79 slices shown]
[im 27/79  brain]
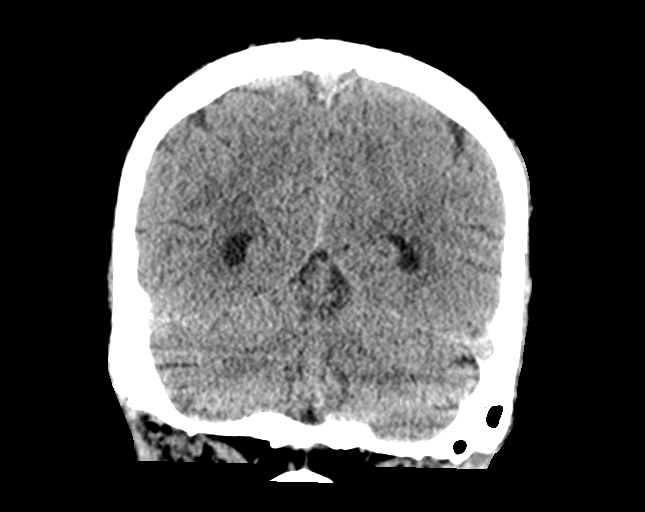
[im 35/79  brain]
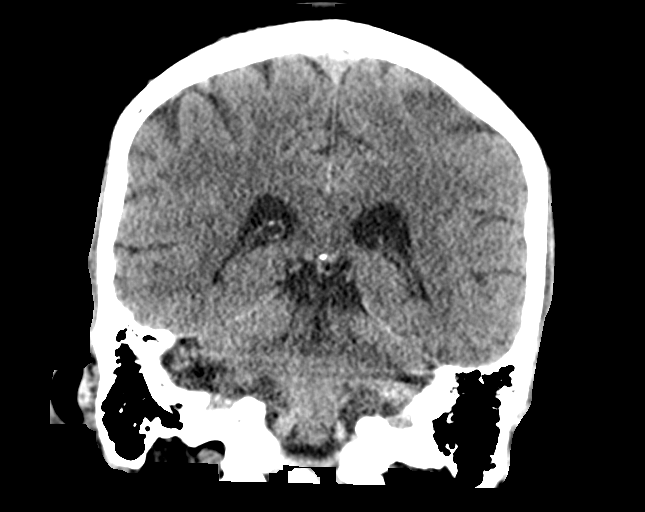
[im 44/79  brain]
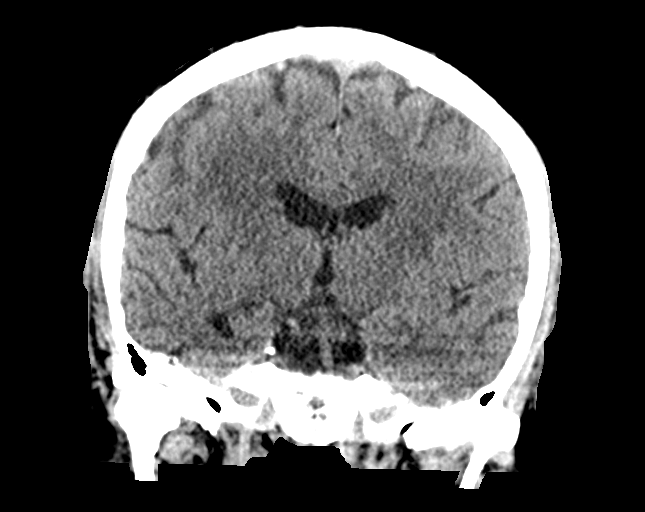

[Series 5: sagittal soft tissue · sagittal · 0.32mm/px · 3 of 60 slices shown]
[im 20/60  brain]
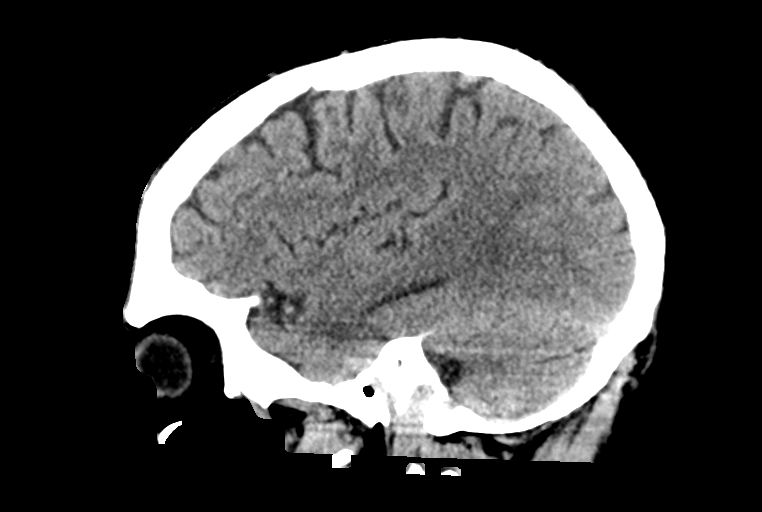
[im 30/60  brain]
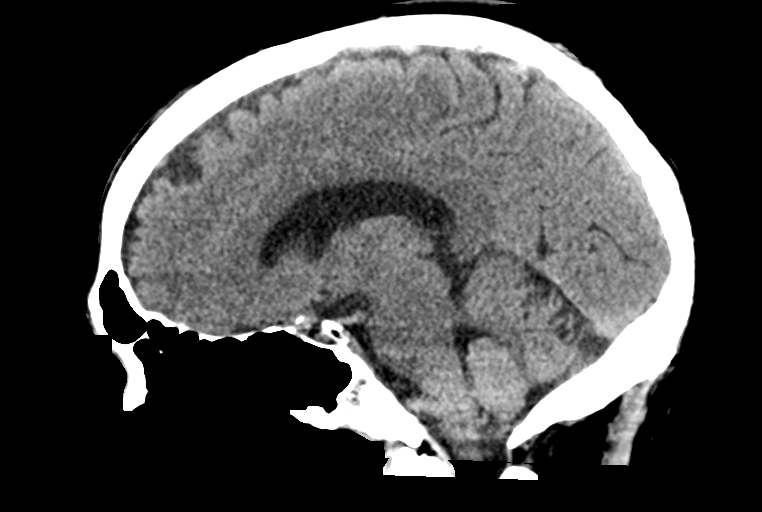
[im 40/60  brain]
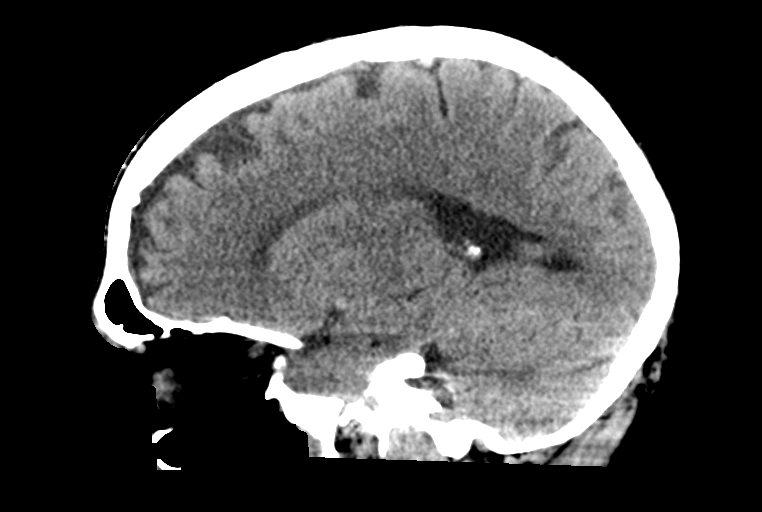

[14 of 47 positions shown; findings below may reference images not displayed]

FINDINGS: Brain: No intracranial hemorrhage, mass effect, or midline shift. No
hydrocephalus. Incidental cavum septum pellucidum, normal variant
anatomy. The basilar cisterns are patent. No evidence of territorial
infarct or acute ischemia. No extra-axial or intracranial fluid
collection.

Vascular: No hyperdense vessel or unexpected calcification.

Skull: No fracture or focal lesion.

Sinuses/Orbits: No evidence of acute facial bone fracture. Paranasal
sinuses and mastoid air cells are clear.

Other: None.
IMPRESSION: No acute intracranial abnormality. No skull fracture.

## 2021-11-01 IMAGING — CT CT CERVICAL SPINE W/O CM
3 of 4 series · 13 of 33 positions shown, 16 images · non-contrast
Comparison: None.

CLINICAL DATA: Neck trauma. Fall with loss of consciousness while
ambulating striking head.

EXAM:
CT CERVICAL SPINE WITHOUT CONTRAST
TECHNIQUE: Multidetector CT imaging of the cervical spine was performed without
intravenous contrast. Multiplanar CT image reconstructions were also
generated.

[Series 6: orthogonal bone · axial · 0.23mm/px · z∈[-136,+17]mm · 5 of 116 slices shown, 7 images]
[im 17/116  soft-tissue]
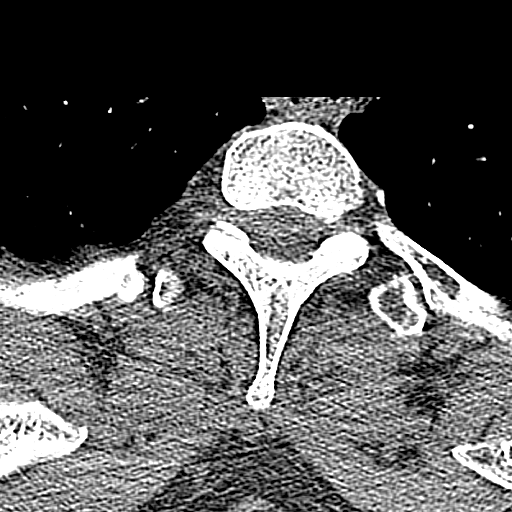
[im 17/116  bone]
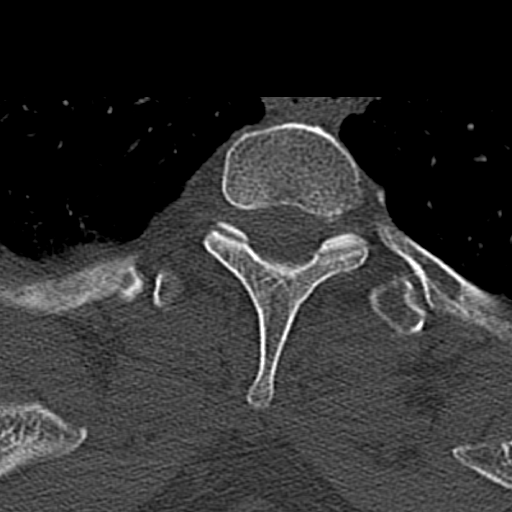
[im 33/116  bone]
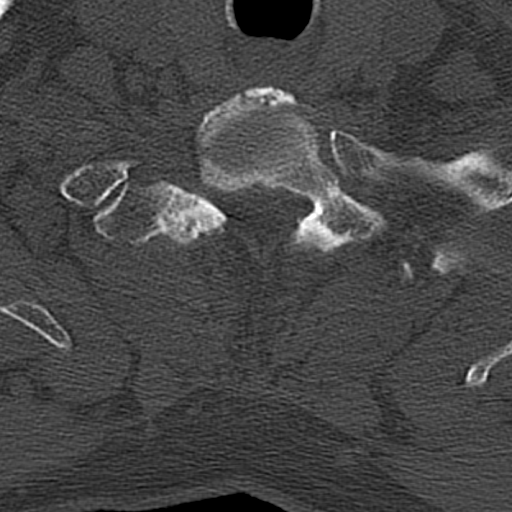
[im 66/116  bone]
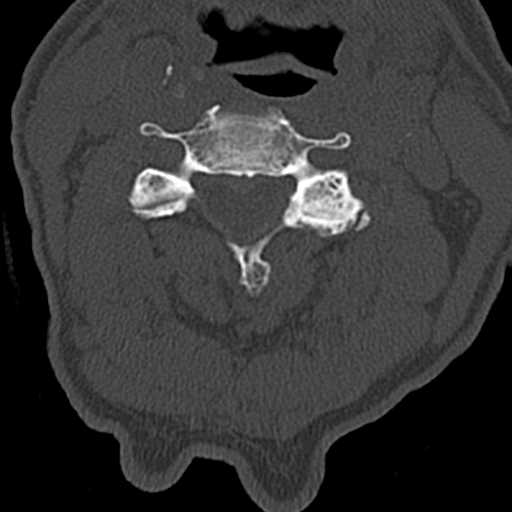
[im 83/116  bone]
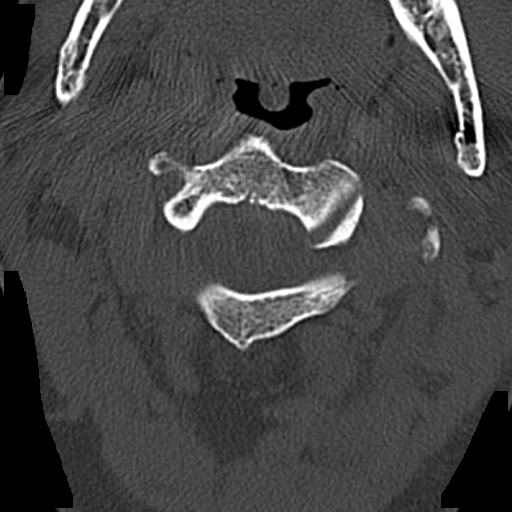
[im 99/116  soft-tissue]
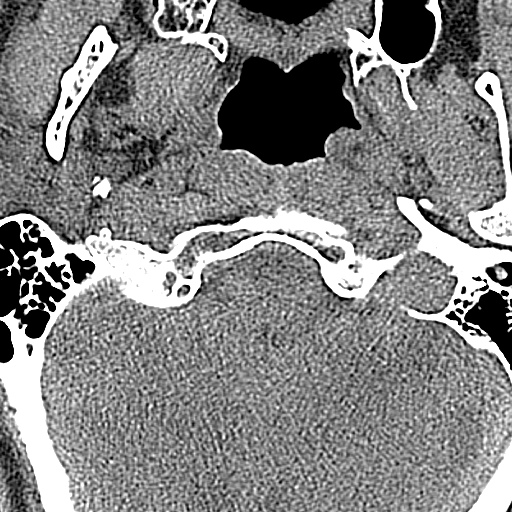
[im 99/116  bone]
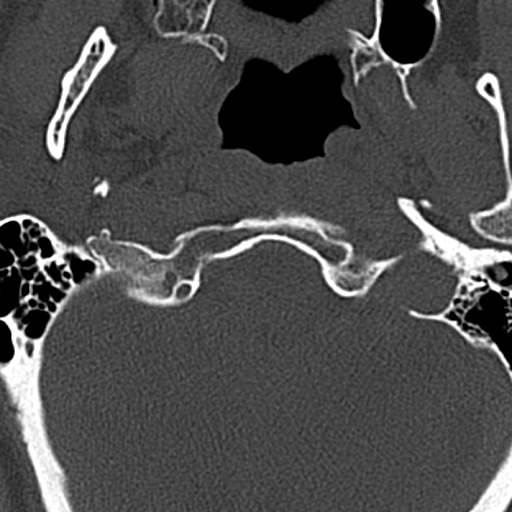

[Series 7: sagittal bone · sagittal · 0.29mm/px · 5 of 66 slices shown, 6 images]
[im 22/66  bone]
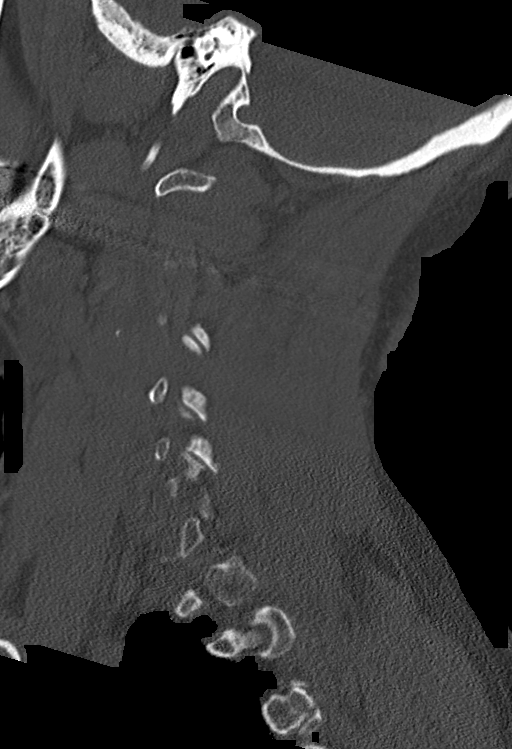
[im 28/66  bone]
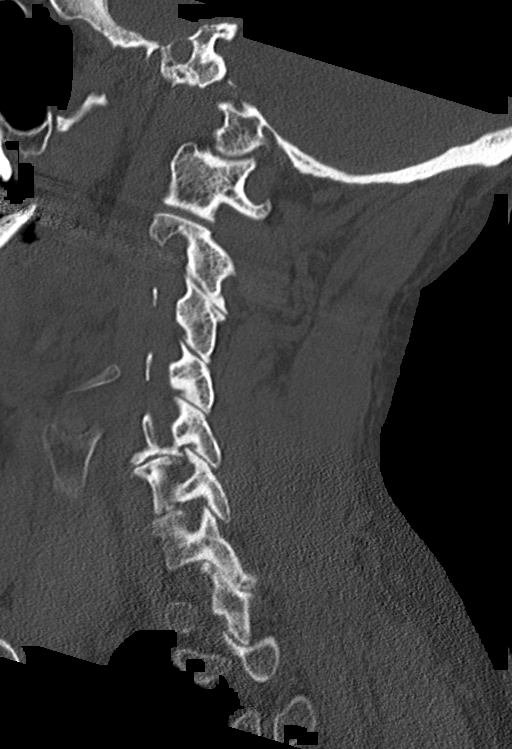
[im 33/66  soft-tissue]
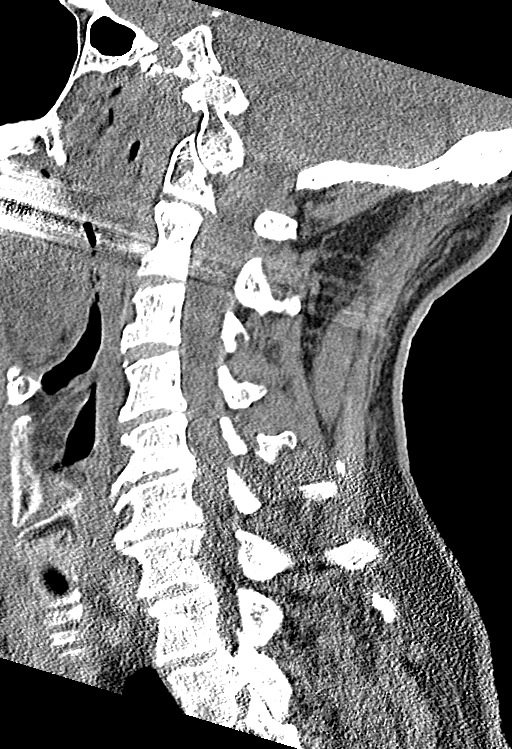
[im 33/66  bone]
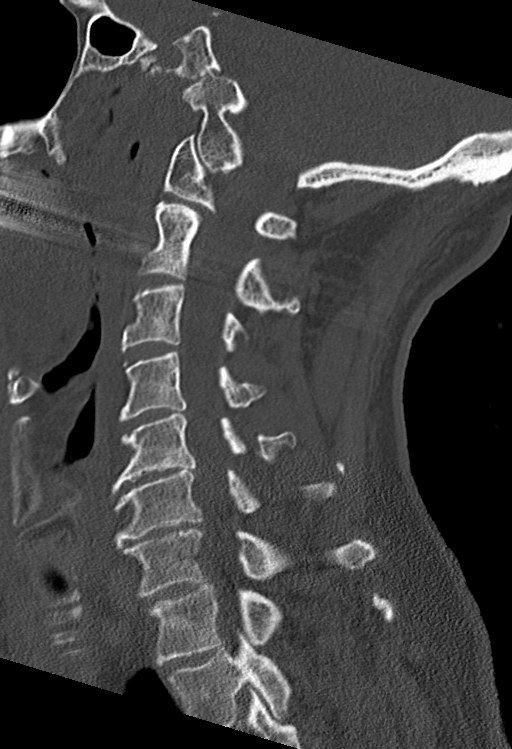
[im 38/66  bone]
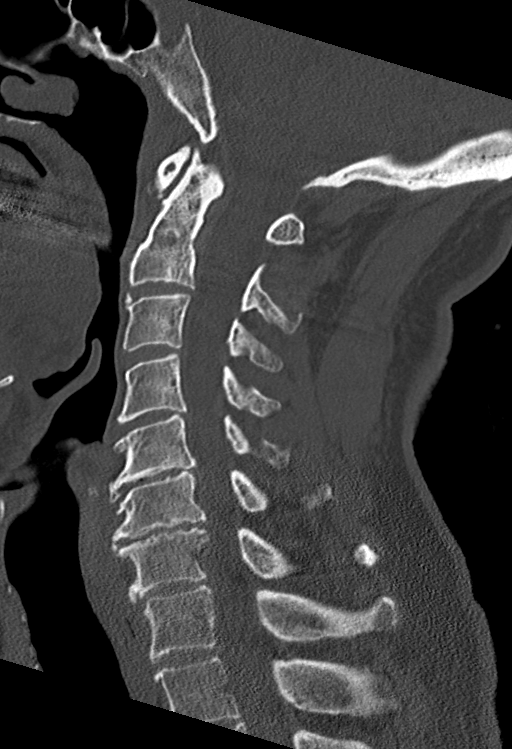
[im 44/66  bone]
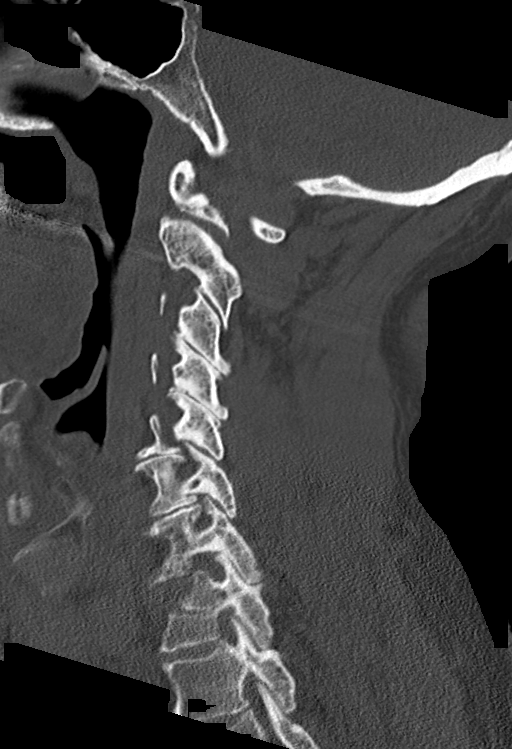

[Series 8: coronal bone · coronal · 0.29mm/px · 3 of 61 slices shown]
[im 13/61  bone]
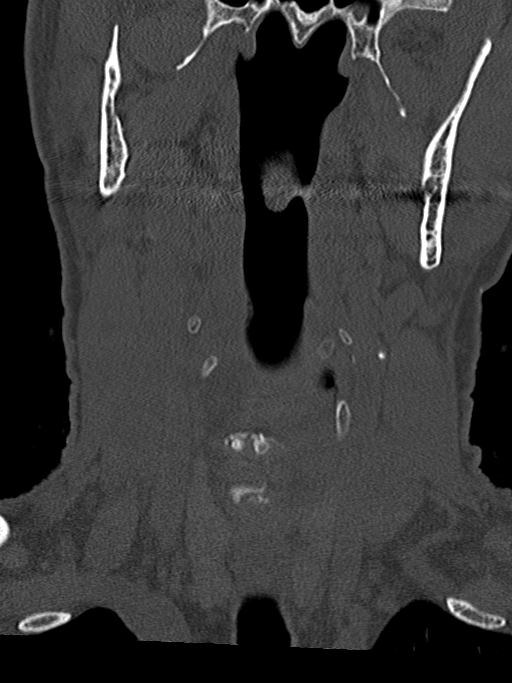
[im 25/61  bone]
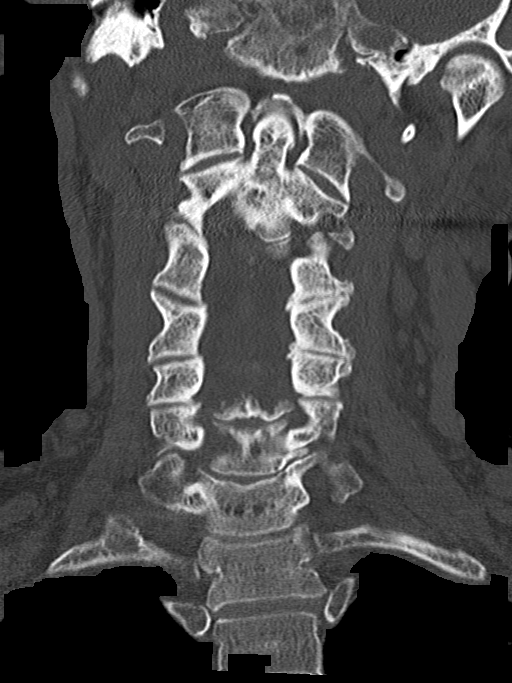
[im 37/61  bone]
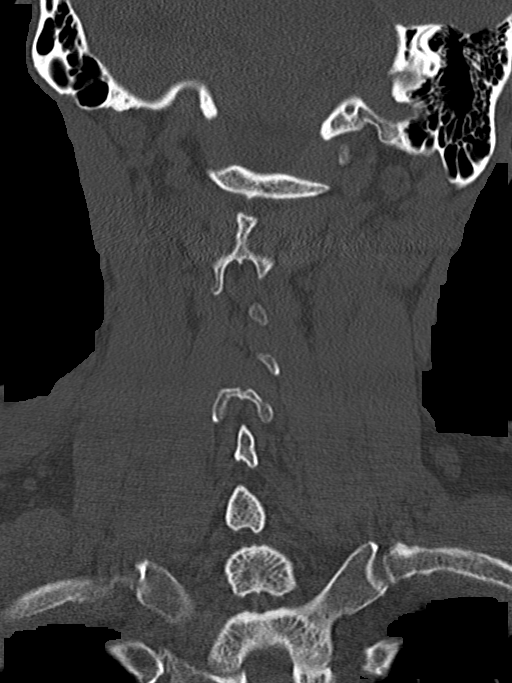

[13 of 33 positions shown; findings below may reference images not displayed]

FINDINGS: Alignment: Slight broad-based rightward curvature may be positioning
or scoliosis. No traumatic subluxation. No listhesis.

Skull base and vertebrae: No acute fracture. Vertebral body heights
are maintained. The dens and skull base are intact.

Soft tissues and spinal canal: No prevertebral fluid or swelling. No
visible canal hematoma.

Disc levels: Disc space narrowing and endplate spurring C4-C5,
C5-C6, and C6-C7. There is multilevel facet hypertrophy. Mild canal
and left neural foraminal stenosis at C5-C6 and C6-C7.

Upper chest: No acute or unexpected findings.

Other: None.
IMPRESSION: Degenerative change in the cervical spine without acute fracture or
subluxation.

## 2021-11-17 ENCOUNTER — Other Ambulatory Visit: Payer: Self-pay

## 2021-11-17 ENCOUNTER — Ambulatory Visit: Payer: Medicare Other | Admitting: Dermatology

## 2021-11-17 DIAGNOSIS — Z85828 Personal history of other malignant neoplasm of skin: Secondary | ICD-10-CM | POA: Diagnosis not present

## 2021-11-17 DIAGNOSIS — L820 Inflamed seborrheic keratosis: Secondary | ICD-10-CM | POA: Diagnosis not present

## 2021-11-17 DIAGNOSIS — Z86018 Personal history of other benign neoplasm: Secondary | ICD-10-CM

## 2021-11-17 DIAGNOSIS — Z1283 Encounter for screening for malignant neoplasm of skin: Secondary | ICD-10-CM | POA: Diagnosis not present

## 2021-11-17 DIAGNOSIS — D1801 Hemangioma of skin and subcutaneous tissue: Secondary | ICD-10-CM

## 2021-11-17 DIAGNOSIS — L578 Other skin changes due to chronic exposure to nonionizing radiation: Secondary | ICD-10-CM

## 2021-11-17 DIAGNOSIS — D229 Melanocytic nevi, unspecified: Secondary | ICD-10-CM

## 2021-11-17 DIAGNOSIS — L821 Other seborrheic keratosis: Secondary | ICD-10-CM

## 2021-11-17 DIAGNOSIS — L814 Other melanin hyperpigmentation: Secondary | ICD-10-CM

## 2021-11-17 NOTE — Patient Instructions (Signed)

## 2021-11-17 NOTE — Progress Notes (Signed)
Follow-Up Visit   Subjective  Cole Holt is a 75 y.o. male who presents for the following: Annual Exam (History of BCC, Dysplastic nevus - TBSE today). The patient presents for Total-Body Skin Exam (TBSE) for skin cancer screening and mole check. He has an irritating lesion on the right flank he would like evaluated and treated if possible. The patient has spots, moles and lesions to be evaluated, some may be new or changing and the patient has concerns that these could be cancer.  The following portions of the chart were reviewed this encounter and updated as appropriate:   Tobacco  Allergies  Meds  Problems  Med Hx  Surg Hx  Fam Hx     Review of Systems:  No other skin or systemic complaints except as noted in HPI or Assessment and Plan.  Objective  Well appearing patient in no apparent distress; mood and affect are within normal limits.  A full examination was performed including scalp, head, eyes, ears, nose, lips, neck, chest, axillae, abdomen, back, buttocks, bilateral upper extremities, bilateral lower extremities, hands, feet, fingers, toes, fingernails, and toenails. All findings within normal limits unless otherwise noted below.  Right Flank Erythematous keratotic or waxy stuck-on papule or plaque.    Assessment & Plan   History of Dysplastic Nevi - No evidence of recurrence today - Recommend regular full body skin exams - Recommend daily broad spectrum sunscreen SPF 30+ to sun-exposed areas, reapply every 2 hours as needed.  - Call if any new or changing lesions are noted between office visits  History of Basal Cell Carcinoma of the Skin - No evidence of recurrence today - Recommend regular full body skin exams - Recommend daily broad spectrum sunscreen SPF 30+ to sun-exposed areas, reapply every 2 hours as needed.  - Call if any new or changing lesions are noted between office visits  Lentigines - Scattered tan macules - Due to sun exposure -  Benign-appearing, observe - Recommend daily broad spectrum sunscreen SPF 30+ to sun-exposed areas, reapply every 2 hours as needed. - Call for any changes  Seborrheic Keratoses - Stuck-on, waxy, tan-brown papules and/or plaques  - Benign-appearing - Discussed benign etiology and prognosis. - Observe - Call for any changes  Melanocytic Nevi - Tan-brown and/or pink-flesh-colored symmetric macules and papules - Benign appearing on exam today - Observation - Call clinic for new or changing moles - Recommend daily use of broad spectrum spf 30+ sunscreen to sun-exposed areas.   Hemangiomas - Red papules - Discussed benign nature - Observe - Call for any changes  Actinic Damage - Chronic condition, secondary to cumulative UV/sun exposure - diffuse scaly erythematous macules with underlying dyspigmentation - Recommend daily broad spectrum sunscreen SPF 30+ to sun-exposed areas, reapply every 2 hours as needed.  - Staying in the shade or wearing long sleeves, sun glasses (UVA+UVB protection) and wide brim hats (4-inch brim around the entire circumference of the hat) are also recommended for sun protection.  - Call for new or changing lesions.  Skin cancer screening performed today.  Inflamed seborrheic keratosis Right Flank  Destruction of lesion - Right Flank Complexity: simple   Destruction method: cryotherapy   Informed consent: discussed and consent obtained   Timeout:  patient name, date of birth, surgical site, and procedure verified Lesion destroyed using liquid nitrogen: Yes   Region frozen until ice ball extended beyond lesion: Yes   Outcome: patient tolerated procedure well with no complications   Post-procedure details: wound care  instructions given    Skin cancer screening  Return in about 1 year (around 11/17/2022) for TBSE.  I, Ashok Cordia, CMA, am acting as scribe for Sarina Ser, MD . Documentation: I have reviewed the above documentation for accuracy and  completeness, and I agree with the above.  Sarina Ser, MD

## 2021-12-01 ENCOUNTER — Encounter: Payer: Self-pay | Admitting: Dermatology

## 2022-03-30 ENCOUNTER — Telehealth: Payer: Self-pay | Admitting: Urology

## 2022-03-30 NOTE — Telephone Encounter (Signed)
Patient called and said that [facility] called and said that they told him they were no longer stocking Finasteride. He said they did not say for how long. He said he has a month left. If he had to get it locally he uses [facility] in [place]. He was told to contact us. ?

## 2022-05-20 ENCOUNTER — Other Ambulatory Visit: Payer: Self-pay | Admitting: Urology

## 2022-05-20 DIAGNOSIS — N401 Enlarged prostate with lower urinary tract symptoms: Secondary | ICD-10-CM

## 2022-05-21 ENCOUNTER — Other Ambulatory Visit: Payer: Self-pay | Admitting: Urology

## 2022-07-14 ENCOUNTER — Other Ambulatory Visit: Payer: Self-pay | Admitting: Urology

## 2022-07-14 DIAGNOSIS — N138 Other obstructive and reflux uropathy: Secondary | ICD-10-CM

## 2022-08-03 ENCOUNTER — Encounter: Payer: Self-pay | Admitting: *Deleted

## 2022-08-04 ENCOUNTER — Ambulatory Visit: Payer: Medicare Other | Admitting: Anesthesiology

## 2022-08-04 ENCOUNTER — Ambulatory Visit
Admission: RE | Admit: 2022-08-04 | Discharge: 2022-08-04 | Disposition: A | Discharge status: Home / Self Care | Payer: Medicare Other | Attending: Gastroenterology | Admitting: Gastroenterology

## 2022-08-04 ENCOUNTER — Encounter: Payer: Self-pay | Admitting: *Deleted

## 2022-08-04 ENCOUNTER — Encounter
Admission: RE | Disposition: A | Discharge status: Home / Self Care | Payer: Self-pay | Source: Home / Self Care | Attending: Gastroenterology

## 2022-08-04 DIAGNOSIS — Z79899 Other long term (current) drug therapy: Secondary | ICD-10-CM | POA: Diagnosis not present

## 2022-08-04 DIAGNOSIS — M1990 Unspecified osteoarthritis, unspecified site: Secondary | ICD-10-CM | POA: Diagnosis not present

## 2022-08-04 DIAGNOSIS — E7800 Pure hypercholesterolemia, unspecified: Secondary | ICD-10-CM | POA: Diagnosis not present

## 2022-08-04 DIAGNOSIS — K2960 Other gastritis without bleeding: Secondary | ICD-10-CM | POA: Insufficient documentation

## 2022-08-04 DIAGNOSIS — K2970 Gastritis, unspecified, without bleeding: Secondary | ICD-10-CM | POA: Diagnosis present

## 2022-08-04 DIAGNOSIS — I10 Essential (primary) hypertension: Secondary | ICD-10-CM | POA: Diagnosis not present

## 2022-08-04 DIAGNOSIS — K449 Diaphragmatic hernia without obstruction or gangrene: Secondary | ICD-10-CM | POA: Insufficient documentation

## 2022-08-04 DIAGNOSIS — Z87891 Personal history of nicotine dependence: Secondary | ICD-10-CM | POA: Diagnosis not present

## 2022-08-04 HISTORY — PX: ESOPHAGOGASTRODUODENOSCOPY (EGD) WITH PROPOFOL: SHX5813

## 2022-08-04 SURGERY — ESOPHAGOGASTRODUODENOSCOPY (EGD) WITH PROPOFOL
Anesthesia: General

## 2022-08-04 MED ORDER — PROPOFOL 10 MG/ML IV BOLUS
INTRAVENOUS | Status: DC | PRN
  Administered 2022-08-04: 145 ug/kg/min via INTRAVENOUS

## 2022-08-04 MED ORDER — SODIUM CHLORIDE 0.9 % IV SOLN: INTRAVENOUS | Status: DC

## 2022-08-04 MED ORDER — PROPOFOL 500 MG/50ML IV EMUL
INTRAVENOUS | Status: DC | PRN
  Administered 2022-08-04: 50 mg via INTRAVENOUS

## 2022-08-04 MED ORDER — PROPOFOL 10 MG/ML IV BOLUS
INTRAVENOUS | Status: AC
  Filled 2022-08-04: qty 40

## 2022-08-04 NOTE — Transfer of Care (Signed)
Immediate Anesthesia Transfer of Care Note  Patient: Cole Holt  Procedure(s) Performed: ESOPHAGOGASTRODUODENOSCOPY (EGD) WITH PROPOFOL  Patient Location: Endoscopy Unit  Anesthesia Type:General  Level of Consciousness: drowsy  Airway & Oxygen Therapy: Patient Spontanous Breathing  Post-op Assessment: Report given to RN and Post -op Vital signs reviewed and stable  Post vital signs: Reviewed and stable  Last Vitals:  Vitals Value Taken Time  BP 110/62 08/04/22 0918  Temp    Pulse 75 08/04/22 0919  Resp 13 08/04/22 0919  SpO2 98 % 08/04/22 0919  Vitals shown include unvalidated device data.  Last Pain:  Vitals:   08/04/22 0803  TempSrc: Temporal  PainSc: 0-No pain         Complications: No notable events documented.

## 2022-08-04 NOTE — Interval H&P Note (Signed)
History and Physical Interval Note:  08/04/2022 9:02 AM  Cole Holt  has presented today for surgery, with the diagnosis of Epigastric Pain,Dyspepesia.  The various methods of treatment have been discussed with the patient and family. After consideration of risks, benefits and other options for treatment, the patient has consented to  Procedure(s): ESOPHAGOGASTRODUODENOSCOPY (EGD) WITH PROPOFOL (N/A) as a surgical intervention.  The patient's history has been reviewed, patient examined, no change in status, stable for surgery.  I have reviewed the patient's chart and labs.  Questions were answered to the patient's satisfaction.     Lesly Rubenstein  Ok to proceed with EGD

## 2022-08-04 NOTE — Op Note (Signed)
[facility] Patient Name: Cole Holt Procedure Date: 08/04/2022 8:57 AM MRN: [id number] Account #: [id number] Date of Birth: 09/21/1946 Admit Type: Outpatient Age: 76 Room: [facility] ENDO ROOM 1 Gender: Male Note Status: Finalized Instrument Name: Michaelle Birks [id number] Procedure:             Upper GI endoscopy Indications:           Dyspepsia Providers:             Andrey Farmer MD, MD Medicines:             Monitored Anesthesia Care Complications:         No immediate complications. Estimated blood loss:                         Minimal. Procedure:             Pre-Anesthesia Assessment:                        - Prior to the procedure, a History and Physical was                         performed, and patient medications and allergies were                         reviewed. The patient is competent. The risks and                         benefits of the procedure and the sedation options and                         risks were discussed with the patient. All questions                         were answered and informed consent was obtained.                         Patient identification and proposed procedure were                         verified by the physician, the nurse, the                         anesthesiologist, the anesthetist and the technician                         in the endoscopy suite. Mental Status Examination:                         alert and oriented. Airway Examination: normal                         oropharyngeal airway and neck mobility. Respiratory                         Examination: clear to auscultation. CV Examination:                         normal. Prophylactic Antibiotics: The patient does not  require prophylactic antibiotics. Prior                         Anticoagulants: The patient has taken no previous                         anticoagulant or antiplatelet agents. ASA Grade                          Assessment: II - A patient with mild systemic disease.                         After reviewing the risks and benefits, the patient                         was deemed in satisfactory condition to undergo the                         procedure. The anesthesia plan was to use monitored                         anesthesia care (MAC). Immediately prior to                         administration of medications, the patient was                         re-assessed for adequacy to receive sedatives. The                         heart rate, respiratory rate, oxygen saturations,                         blood pressure, adequacy of pulmonary ventilation, and                         response to care were monitored throughout the                         procedure. The physical status of the patient was                         re-assessed after the procedure.                        After obtaining informed consent, the endoscope was                         passed under direct vision. Throughout the procedure,                         the patient's blood pressure, pulse, and oxygen                         saturations were monitored continuously. The Endoscope                         was introduced through the mouth, and advanced to the  second part of duodenum. The upper GI endoscopy was                         accomplished without difficulty. The patient tolerated                         the procedure well. Findings:      A small hiatal hernia was present.      The exam of the esophagus was otherwise normal.      Localized mild inflammation characterized by erythema was found in the       gastric antrum. Biopsies were taken with a cold forceps for Helicobacter       pylori testing. Estimated blood loss was minimal.      The exam of the stomach was otherwise normal.      The examined duodenum was normal. Impression:            - Small hiatal hernia.                        -  Gastritis. Biopsied.                        - Normal examined duodenum. Recommendation:        - Discharge patient to home.                        - Resume previous diet.                        - Continue present medications.                        - Await pathology results.                        - Return to referring physician as previously                         scheduled. Procedure Code(s):     --- Professional ---                        [phone], Esophagogastroduodenoscopy, flexible,                         transoral; with biopsy, single or multiple Diagnosis Code(s):     --- Professional ---                        K44.9, Diaphragmatic hernia without obstruction or                         gangrene                        K29.70, Gastritis, unspecified, without bleeding                        R10.13, Epigastric pain CPT copyright 2019 [employer]. All rights reserved. The codes documented in this report are preliminary and upon coder review may  be revised to meet current compliance requirements. Andrey Farmer MD, MD 08/04/2022 9:22:37 AM Number of Addenda:  0 Note Initiated On: 08/04/2022 8:57 AM Estimated Blood Loss:  Estimated blood loss was minimal.      [facility]

## 2022-08-04 NOTE — H&P (Signed)
Outpatient short stay form Pre-procedure 08/04/2022  Lesly Rubenstein, MD  Primary Physician: Sofie Hartigan, MD  Reason for visit:  Dyspepsia  History of present illness:    76 y/o gentleman with history of hypertension, HLD, and arthritis for which he takes NSAIDS who is here for EGD for dyspeptic symptoms. No blood thinners. No family history of GI malignancies. No neck surgeries.    Current Facility-Administered Medications:    0.9 %  sodium chloride infusion, , Intravenous, Continuous, Patriece Archbold, Hilton Cork, MD, Last Rate: 20 mL/hr at 08/04/22 0826, New Bag at 08/04/22 0826  Medications Prior to Admission  Medication Sig Dispense Refill Last Dose   aspirin 81 MG chewable tablet Chew by mouth.   08/03/2022   atorvastatin (LIPITOR) 10 MG tablet Take 10 mg by mouth daily.   08/03/2022   finasteride (PROSCAR) 5 MG tablet TAKE 1 TABLET BY MOUTH DAILY 90 tablet 3 08/03/2022   lisinopril-hydrochlorothiazide (PRINZIDE,ZESTORETIC) 10-12.5 MG tablet    08/04/2022 at 0400   tamsulosin (FLOMAX) 0.4 MG CAPS capsule TAKE 1 CAPSULE BY MOUTH  DAILY 90 capsule 0 08/03/2022   meloxicam (MOBIC) 15 MG tablet Take 15 mg by mouth daily.      Multiple Vitamins-Minerals (MULTIVITAMIN ADULT PO) Take by mouth daily.      pantoprazole (PROTONIX) 20 MG tablet Take 1 tablet (20 mg total) by mouth daily for 15 days. 15 tablet 0      No Known Allergies   Past Medical History:  Diagnosis Date   Acute cystitis with hematuria    Anxiety    Arthritis    Basal cell carcinoma 05/02/2019   right post auricular crease/excision   BPH (benign prostatic hyperplasia)    Diverticulitis 2012   Dysplastic nevus 05/06/2020   Right sup. medial scapula. Severe atypia, close to margin. Excised 06/25/2020, margins free.   Hypercholesterolemia    Hypertension    Urine retention     Review of systems:  Otherwise negative.    Physical Exam  Gen: Alert, oriented. Appears stated age.  HEENT: PERRLA. Lungs: No  respiratory distress CV: RRR Abd: soft, benign, no masses Ext: No edema    Planned procedures: Proceed with EGD. The patient understands the nature of the planned procedure, indications, risks, alternatives and potential complications including but not limited to bleeding, infection, perforation, damage to internal organs and possible oversedation/side effects from anesthesia. The patient agrees and gives consent to proceed.  Please refer to procedure notes for findings, recommendations and patient disposition/instructions.     Lesly Rubenstein, MD [facility]

## 2022-08-04 NOTE — Anesthesia Postprocedure Evaluation (Signed)
Anesthesia Post Note  Patient: Cole Holt  Procedure(s) Performed: ESOPHAGOGASTRODUODENOSCOPY (EGD) WITH PROPOFOL  Patient location during evaluation: PACU Anesthesia Type: General Level of consciousness: awake and alert Pain management: pain level controlled Vital Signs Assessment: post-procedure vital signs reviewed and stable Respiratory status: spontaneous breathing, nonlabored ventilation and respiratory function stable Cardiovascular status: blood pressure returned to baseline and stable Postop Assessment: no apparent nausea or vomiting Anesthetic complications: no   No notable events documented.   Last Vitals:  Vitals:   08/04/22 0929 08/04/22 0939  BP: 125/77 120/78  Pulse: 82 64  Resp: 14 12  Temp:    SpO2: 99% 100%    Last Pain:  Vitals:   08/04/22 0939  TempSrc:   PainSc: 0-No pain                 [place] Ouch

## 2022-08-04 NOTE — Anesthesia Preprocedure Evaluation (Addendum)
Anesthesia Evaluation  Patient identified by MRN, date of birth, ID band Patient awake    Reviewed: Allergy & Precautions, NPO status , Patient's Chart, lab work & pertinent test results  Airway Mallampati: III  TM Distance: >3 FB Neck ROM: full    Dental no notable dental hx.    Pulmonary former smoker,    Pulmonary exam normal        Cardiovascular hypertension, Pt. on medications Normal cardiovascular exam     Neuro/Psych negative neurological ROS     GI/Hepatic negative GI ROS, Neg liver ROS,   Endo/Other  negative endocrine ROS  Renal/GU negative Renal ROS  negative genitourinary   Musculoskeletal  (+) Arthritis ,   Abdominal Normal abdominal exam  (+)   Peds  Hematology negative hematology ROS (+)   Anesthesia Other Findings Past Medical History: No date: Acute cystitis with hematuria No date: Anxiety No date: Arthritis 05/02/2019: Basal cell carcinoma     Comment:  right post auricular crease/excision No date: BPH (benign prostatic hyperplasia) 2012: Diverticulitis 05/06/2020: Dysplastic nevus     Comment:  Right sup. medial scapula. Severe atypia, close to               margin. Excised 06/25/2020, margins free. No date: Hypercholesterolemia No date: Hypertension No date: Urine retention  Past Surgical History: 1960: APPENDECTOMY 11/13/2016: COLONOSCOPY WITH PROPOFOL; N/A     Comment:  Procedure: COLONOSCOPY WITH PROPOFOL;  Surgeon: Manya Silvas, MD;  Location: [facility] ENDOSCOPY;  Service:               Endoscopy;  Laterality: N/A; 2016: ingiunal hernia repair     Comment:  1995, 1989 1996: RHINOPLASTY  BMI    Body Mass Index: 23.57 kg/m      Reproductive/Obstetrics negative OB ROS                            Anesthesia Physical Anesthesia Plan  ASA: 2  Anesthesia Plan: General   Post-op Pain Management: Minimal or no pain anticipated    Induction: Intravenous  PONV Risk Score and Plan: TIVA and Propofol infusion  Airway Management Planned: Natural Airway  Additional Equipment:   Intra-op Plan:   Post-operative Plan:   Informed Consent: I have reviewed the patients History and Physical, chart, labs and discussed the procedure including the risks, benefits and alternatives for the proposed anesthesia with the patient or authorized representative who has indicated his/her understanding and acceptance.     Dental Advisory Given  Plan Discussed with: Anesthesiologist, CRNA and Surgeon  Anesthesia Plan Comments:        Anesthesia Quick Evaluation

## 2022-08-05 ENCOUNTER — Encounter: Payer: Self-pay | Admitting: Gastroenterology

## 2022-08-05 LAB — SURGICAL PATHOLOGY

## 2022-08-21 ENCOUNTER — Other Ambulatory Visit: Payer: Self-pay

## 2022-08-21 DIAGNOSIS — N401 Enlarged prostate with lower urinary tract symptoms: Secondary | ICD-10-CM

## 2022-08-21 DIAGNOSIS — R972 Elevated prostate specific antigen [PSA]: Secondary | ICD-10-CM

## 2022-08-24 ENCOUNTER — Other Ambulatory Visit: Payer: Medicare Other

## 2022-08-24 DIAGNOSIS — R972 Elevated prostate specific antigen [PSA]: Secondary | ICD-10-CM

## 2022-08-24 DIAGNOSIS — N138 Other obstructive and reflux uropathy: Secondary | ICD-10-CM

## 2022-08-25 LAB — PSA: Prostate Specific Ag, Serum: 1.1 ng/mL (ref 0.0–4.0)

## 2022-08-25 NOTE — Progress Notes (Signed)
7:54 PM   Cole Holt [birth year] [id number]  Referring provider: Sofie Hartigan, MD [place],  [place] [address]  Urological history: 1. BPH with LU TS -I PSS 4/1 -tamsulosin 0.4 mg daily and finasteride 5 mg daily  2. ED -contributing factors of age, BPH, HTN, HLD, former smoker and anxiety -not interested in therapy  3. Prostate cancer screening -PSA (07/2022) 1.1 -corrected 2.2 -no family history of prostate, breast or ovarian cancer   Chief Complaint  Patient presents with   Benign Prostatic Hypertrophy    HPI: Cole Holt is a 76 y.o. male who presents today for yearly appointment.    He has no specific urinary complaints at this visit.  He just has his baseline nocturia x2 and some frequency.  Patient denies any modifying or aggravating factors.  Patient denies any gross hematuria, dysuria or suprapubic/flank pain.  Patient denies any fevers, chills, nausea or vomiting.     IPSS     Row Name 08/26/22 1500         International Prostate Symptom Score   How often have you had the sensation of not emptying your bladder? Not at All     How often have you had to urinate less than every two hours? Less than half the time     How often have you found you stopped and started again several times when you urinated? Not at All     How often have you found it difficult to postpone urination? Not at All     How often have you had a weak urinary stream? Not at All     How often have you had to strain to start urination? Not at All     How many times did you typically get up at night to urinate? 2 Times     Total IPSS Score 4       Quality of Life due to urinary symptoms   If you were to spend the rest of your life with your urinary condition just the way it is now how would you feel about that? Pleased                Score:  1-7 Mild 8-19 Moderate 20-35 Severe    PMH: Past Medical History:  Diagnosis Date   Acute cystitis  with hematuria    Anxiety    Arthritis    Basal cell carcinoma 05/02/2019   right post auricular crease/excision   BPH (benign prostatic hyperplasia)    Diverticulitis 2012   Dysplastic nevus 05/06/2020   Right sup. medial scapula. Severe atypia, close to margin. Excised 06/25/2020, margins free.   Hypercholesterolemia    Hypertension    Urine retention     Surgical History: Past Surgical History:  Procedure Laterality Date   APPENDECTOMY  1960   COLONOSCOPY WITH PROPOFOL N/A 11/13/2016   Procedure: COLONOSCOPY WITH PROPOFOL;  Surgeon: Manya Silvas, MD;  Location: [facility] ENDOSCOPY;  Service: Endoscopy;  Laterality: N/A;   ESOPHAGOGASTRODUODENOSCOPY (EGD) WITH PROPOFOL N/A 08/04/2022   Procedure: ESOPHAGOGASTRODUODENOSCOPY (EGD) WITH PROPOFOL;  Surgeon: Lesly Rubenstein, MD;  Location: [facility] ENDOSCOPY;  Service: Endoscopy;  Laterality: N/A;   ingiunal hernia repair  2016   1995, [place] Medications:  Allergies as of 08/26/2022   No Known Allergies      Medication List        Accurate as of August 26, 2022  7:54 PM. If you have any questions, ask your nurse or doctor.          aspirin 81 MG chewable tablet Chew by mouth.   atorvastatin 10 MG tablet Commonly known as: LIPITOR Take 10 mg by mouth daily.   finasteride 5 MG tablet Commonly known as: PROSCAR TAKE 1 TABLET BY MOUTH DAILY   lisinopril-hydrochlorothiazide 10-12.5 MG tablet Commonly known as: ZESTORETIC   meloxicam 15 MG tablet Commonly known as: MOBIC Take 15 mg by mouth daily.   MULTIVITAMIN ADULT PO Take by mouth daily.   pantoprazole 20 MG tablet Commonly known as: PROTONIX Take 1 tablet (20 mg total) by mouth daily for 15 days.   tamsulosin 0.4 MG Caps capsule Commonly known as: FLOMAX TAKE 1 CAPSULE BY MOUTH  DAILY        Allergies: No Known Allergies  Family History: Family History  Problem Relation Age of Onset   Cancer Mother    Hypertension  Mother    Lung cancer Father    Prostate cancer Neg Hx    Kidney cancer Neg Hx    Bladder Cancer Neg Hx     Social History:  reports that he has quit smoking. He has never used smokeless tobacco. He reports current alcohol use. He reports that he does not use drugs.  ROS: For pertinent review of systems please refer to history of present illness  Physical Exam: BP 128/71   Pulse 65   Ht '5\' 8"'  (1.727 m)   Wt 155 lb (70.3 kg)   BMI 23.57 kg/m   Constitutional:  Well nourished. Alert and oriented, No acute distress. HEENT: [place] AT, moist mucus membranes.  Trachea midline Cardiovascular: No clubbing, cyanosis, or edema. Respiratory: Normal respiratory effort, no increased work of breathing. Neurologic: Grossly intact, no focal deficits, moving all 4 extremities. Psychiatric: Normal mood and affect.   Laboratory Data:  Prostate Specific Ag, Serum  Latest Ref Rng 0.0 - 4.0 ng/mL  07/07/2018 0.9   07/13/2019 1.0   07/16/2020 1.0   07/16/2021 3.4   08/26/2021 1.0   08/24/2022 1.1    Glucose 70 - 110 mg/dL 87   Sodium 136 - 145 mmol/L 137   Potassium 3.6 - 5.1 mmol/L 4.5   Chloride 97 - 109 mmol/L 100   Carbon Dioxide (CO2) 22.0 - 32.0 mmol/L 32.9 High    Urea Nitrogen (BUN) 7 - 25 mg/dL 12   Creatinine 0.7 - 1.3 mg/dL 0.7   Glomerular Filtration Rate (eGFR), MDRD Estimate >60 mL/min/1.73sq m 110   Calcium 8.7 - 10.3 mg/dL 9.7   AST  8 - 39 U/L 21   ALT  6 - 57 U/L 24   Alk Phos (alkaline Phosphatase) 34 - 104 U/L 64   Albumin 3.5 - 4.8 g/dL 4.4   Bilirubin, Total 0.3 - 1.2 mg/dL 0.7   Protein, Total 6.1 - 7.9 g/dL 6.4   A/G Ratio 1.0 - 5.0 gm/dL 2.2   Resulting Agency  [facility] - LAB   Specimen Collected: 01/26/22 08:31 Last Resulted: 01/26/22 12:07  Received From: [facility]  Result Received: 03/30/22 14:37   Cholesterol, Total 100 - 200 mg/dL 154   Triglyceride 35 - 199 mg/dL 93   HDL (High Density Lipoprotein) Cholesterol 29.0 - 71.0 mg/dL  61.3   LDL Calculated 0 - 130 mg/dL 74   VLDL Cholesterol mg/dL 19   Cholesterol/HDL Ratio  2.5   Resulting Agency  [facility] - LAB  Specimen Collected: 01/26/22 08:31 Last Resulted: 01/26/22 12:07  Received From: [facility]  Result Received: 03/30/22 14:37   Hemoglobin A1C 4.2 - 5.6 % 5.4   Average Blood Glucose (Calc) mg/dL 108   Resulting [facility] - LAB  Narrative Performed by [facility] - LAB Normal Range:    4.2 - 5.6%  Increased Risk:  5.7 - 6.4%  Diabetes:        >= 6.5%  Glycemic Control for adults with diabetes:  <7%    Specimen Collected: 01/26/22 08:31 Last Resulted: 01/26/22 14:26  Received From: [facility]  Result Received: 03/30/22 14:37   WBC (White Blood Cell Count) 4.1 - 10.2 10^3/uL 4.5   RBC (Red Blood Cell Count) 4.69 - 6.13 10^6/uL 4.37 Low    Hemoglobin 14.1 - 18.1 gm/dL 14.1   Hematocrit 40.0 - 52.0 % 42.3   MCV (Mean Corpuscular Volume) 80.0 - 100.0 fl 96.8   MCH (Mean Corpuscular Hemoglobin) 27.0 - 31.2 pg 32.3 High    MCHC (Mean Corpuscular Hemoglobin Concentration) 32.0 - 36.0 gm/dL 33.3   Platelet Count 150 - 450 10^3/uL 182   RDW-CV (Red Cell Distribution Width) 11.6 - 14.8 % 11.9   MPV (Mean Platelet Volume) 9.4 - 12.4 fl 10.2   Neutrophils 1.50 - 7.80 10^3/uL 2.30   Lymphocytes 1.00 - 3.60 10^3/uL 1.70   Mixed Count 0.10 - 0.90 10^3/uL 0.50   Neutrophil % 32.0 - 70.0 % 51.5   Lymphocyte % 10.0 - 50.0 % 37.3   Mixed % 3.0 - 14.4 % 11.2   Resulting Agency  [facility] - LAB   Specimen Collected: 01/26/22 08:31 Last Resulted: 01/26/22 09:12  Received From: [facility]  Result Received: 03/30/22 14:37  I have reviewed the labs  Assessment & Plan:    1. BPH with LUTS -PSA stable -continue conservative management, avoiding bladder irritants and timed voiding's -Continue tamsulosin 0.4 mg daily and finasteride 5 mg daily   Return in  about 1 year (around 08/27/2023) for PSA, I PSS and exam .  Zara Council, PA-C   [facility] [address] [place], [place] [address] [phone]

## 2022-08-25 NOTE — Progress Notes (Incomplete)
11:53 PM   Cole Holt 11-24-1946 [id number]  Referring provider: Sofie Hartigan, MD [place],  [place] [address]  Urological history: 1. BPH with LU TS -I PSS *** -tamsulosin 0.4 mg daily and finasteride 5 mg daily  2. ED -contributing factors of age, BPH, HTN, HLD, former smoker and anxiety -not interested in therapy  3. Prostate cancer screening -PSA (07/2022) 1.1 -corrected 2.2 -no family history of prostate, breast or ovarian cancer   No chief complaint on file.    HPI: Cole Holt is a 76 y.o. male who presents today for yearly appointment.            Score:  1-7 Mild 8-19 Moderate 20-35 Severe    PMH: Past Medical History:  Diagnosis Date  . Acute cystitis with hematuria   . Anxiety   . Arthritis   . Basal cell carcinoma 05/02/2019   right post auricular crease/excision  . BPH (benign prostatic hyperplasia)   . Diverticulitis 2012  . Dysplastic nevus 05/06/2020   Right sup. medial scapula. Severe atypia, close to margin. Excised 06/25/2020, margins free.  [place] Hypercholesterolemia   . Hypertension   . Urine retention     Surgical History: Past Surgical History:  Procedure Laterality Date  . APPENDECTOMY  1960  . COLONOSCOPY WITH PROPOFOL N/A 11/13/2016   Procedure: COLONOSCOPY WITH PROPOFOL;  Surgeon: Manya Silvas, MD;  Location[facility] ENDOSCOPY;  Service: Endoscopy;  Laterality: N/A;  . ESOPHAGOGASTRODUODENOSCOPY (EGD) WITH PROPOFOL N/A 08/04/2022   Procedure: ESOPHAGOGASTRODUODENOSCOPY (EGD) WITH PROPOFOL;  Surgeon: Lesly Rubenstein, MD;  Location: [facility] ENDOSCOPY;  Service: Endoscopy;  Laterality: N/A;  . ingiunal hernia repair  2016   1995, 1989  . RHINOPLASTY  1996    Home Medications:  Allergies as of 08/26/2022   No Known Allergies      Medication List        Accurate as of August 25, 2022 11:53 PM. If you have any questions, ask your nurse or doctor.          aspirin 81 MG chewable  tablet Chew by mouth.   atorvastatin 10 MG tablet Commonly known as: LIPITOR Take 10 mg by mouth daily.   finasteride 5 MG tablet Commonly known as: PROSCAR TAKE 1 TABLET BY MOUTH DAILY   lisinopril-hydrochlorothiazide 10-12.5 MG tablet Commonly known as: ZESTORETIC   meloxicam 15 MG tablet Commonly known as: MOBIC Take 15 mg by mouth daily.   MULTIVITAMIN ADULT PO Take by mouth daily.   pantoprazole 20 MG tablet Commonly known as: PROTONIX Take 1 tablet (20 mg total) by mouth daily for 15 days.   tamsulosin 0.4 MG Caps capsule Commonly known as: FLOMAX TAKE 1 CAPSULE BY MOUTH  DAILY        Allergies: No Known Allergies  Family History: Family History  Problem Relation Age of Onset  . Cancer Mother   . Hypertension Mother   . Lung cancer Father   . Prostate cancer Neg Hx   . Kidney cancer Neg Hx   . Bladder Cancer Neg Hx     Social History:  reports that he has quit smoking. He has never used smokeless tobacco. He reports current alcohol use. He reports that he does not use drugs.  ROS: For pertinent review of systems please refer to history of present illness  Physical Exam: There were no vitals taken for this visit.  Constitutional:  Well nourished. Alert and oriented, No acute distress. HEENT:  [place] AT, moist mucus membranes.  Trachea midline Cardiovascular: No clubbing, cyanosis, or edema. Respiratory: Normal respiratory effort, no increased work of breathing. GU: No CVA tenderness.  No bladder fullness or masses.  Patient with circumcised/uncircumcised phallus. ***Foreskin easily retracted***  Urethral meatus is patent.  No penile discharge. No penile lesions or rashes. Scrotum without lesions, cysts, rashes and/or edema.  Testicles are located scrotally bilaterally. No masses are appreciated in the testicles. Left and right epididymis are normal. Rectal: Patient with  normal sphincter tone. Anus and perineum without scarring or rashes. No rectal masses are  appreciated. Prostate is approximately *** grams, *** nodules are appreciated. Seminal vesicles are normal. Neurologic: Grossly intact, no focal deficits, moving all 4 extremities. Psychiatric: Normal mood and affect.   Laboratory Data:  Prostate Specific Ag, Serum  Latest Ref Rng 0.0 - 4.0 ng/mL  07/07/2018 0.9   07/13/2019 1.0   07/16/2020 1.0   07/16/2021 3.4   08/26/2021 1.0   08/24/2022 1.1    Glucose 70 - 110 mg/dL 87   Sodium 136 - 145 mmol/L 137   Potassium 3.6 - 5.1 mmol/L 4.5   Chloride 97 - 109 mmol/L 100   Carbon Dioxide (CO2) 22.0 - 32.0 mmol/L 32.9 High    Urea Nitrogen (BUN) 7 - 25 mg/dL 12   Creatinine 0.7 - 1.3 mg/dL 0.7   Glomerular Filtration Rate (eGFR), MDRD Estimate >60 mL/min/1.73sq m 110   Calcium 8.7 - 10.3 mg/dL 9.7   AST  8 - 39 U/L 21   ALT  6 - 57 U/L 24   Alk Phos (alkaline Phosphatase) 34 - 104 U/L 64   Albumin 3.5 - 4.8 g/dL 4.4   Bilirubin, Total 0.3 - 1.2 mg/dL 0.7   Protein, Total 6.1 - 7.9 g/dL 6.4   A/G Ratio 1.0 - 5.0 gm/dL 2.2   Resulting Agency  [facility] - LAB   Specimen Collected: 01/26/22 08:31 Last Resulted: 01/26/22 12:07  Received From: [facility]  Result Received: 03/30/22 14:37   Cholesterol, Total 100 - 200 mg/dL 154   Triglyceride 35 - 199 mg/dL 93   HDL (High Density Lipoprotein) Cholesterol 29.0 - 71.0 mg/dL 61.3   LDL Calculated 0 - 130 mg/dL 74   VLDL Cholesterol mg/dL 19   Cholesterol/HDL Ratio  2.5   Resulting Agency  [facility] - LAB   Specimen Collected: 01/26/22 08:31 Last Resulted: 01/26/22 12:07  Received From: [facility]  Result Received: 03/30/22 14:37   Hemoglobin A1C 4.2 - 5.6 % 5.4   Average Blood Glucose (Calc) mg/dL 108   Resulting Agency  [facility] - LAB  Narrative Performed by [facility] - LAB Normal Range:    4.2 - 5.6%  Increased Risk:  5.7 - 6.4%  Diabetes:        >= 6.5%  Glycemic Control for adults with diabetes:   <7%    Specimen Collected: 01/26/22 08:31 Last Resulted: 01/26/22 14:26  Received From: [facility]  Result Received: 03/30/22 14:37   WBC (White Blood Cell Count) 4.1 - 10.2 10^3/uL 4.5   RBC (Red Blood Cell Count) 4.69 - 6.13 10^6/uL 4.37 Low    Hemoglobin 14.1 - 18.1 gm/dL 14.1   Hematocrit 40.0 - 52.0 % 42.3   MCV (Mean Corpuscular Volume) 80.0 - 100.0 fl 96.8   MCH (Mean Corpuscular Hemoglobin) 27.0 - 31.2 pg 32.3 High    MCHC (Mean Corpuscular Hemoglobin Concentration) 32.0 -  36.0 gm/dL 33.3   Platelet Count 150 - 450 10^3/uL 182   RDW-CV (Red Cell Distribution Width) 11.6 - 14.8 % 11.9   MPV (Mean Platelet Volume) 9.4 - 12.4 fl 10.2   Neutrophils 1.50 - 7.80 10^3/uL 2.30   Lymphocytes 1.00 - 3.60 10^3/uL 1.70   Mixed Count 0.10 - 0.90 10^3/uL 0.50   Neutrophil % 32.0 - 70.0 % 51.5   Lymphocyte % 10.0 - 50.0 % 37.3   Mixed % 3.0 - 14.4 % 11.2   Resulting Agency  [facility] - LAB   Specimen Collected: 01/26/22 08:31 Last Resulted: 01/26/22 09:12  Received From: [facility]  Result Received: 03/30/22 14:37  I have reviewed the labs  Assessment & Plan:    1. BPH with LUTS -PSA stable -DRE benign -UA benign -PVR < 300 cc -symptoms - *** -most bothersome symptoms are *** -continue conservative management, avoiding bladder irritants and timed voiding's -Initiate alpha-blocker (***), discussed side effects *** -Initiate 5 alpha reductase inhibitor (***), discussed side effects *** -Continue tamsulosin 0.4 mg daily, alfuzosin 10 mg daily, Rapaflo 8 mg daily, terazosin, doxazosin, Cialis 5 mg daily and finasteride 5 mg daily, dutasteride 0.5 mg daily***:refills given -Cannot tolerate medication or medication failure, schedule cystoscopy ***    No follow-ups on file.  Royden Purl   [facility] [address] [place], [place] [address] [phone]

## 2022-08-26 ENCOUNTER — Ambulatory Visit (INDEPENDENT_AMBULATORY_CARE_PROVIDER_SITE_OTHER): Payer: Medicare Other | Admitting: Urology

## 2022-08-26 ENCOUNTER — Encounter: Payer: Self-pay | Admitting: Urology

## 2022-08-26 VITALS — BP 128/71 | HR 65 | Ht 68.0 in | Wt 155.0 lb

## 2022-08-26 DIAGNOSIS — N138 Other obstructive and reflux uropathy: Secondary | ICD-10-CM

## 2022-08-26 DIAGNOSIS — N401 Enlarged prostate with lower urinary tract symptoms: Secondary | ICD-10-CM

## 2022-11-17 ENCOUNTER — Ambulatory Visit: Payer: Medicare Other | Admitting: Dermatology

## 2022-11-25 ENCOUNTER — Encounter: Payer: Self-pay | Admitting: Dermatology

## 2022-11-25 ENCOUNTER — Ambulatory Visit: Payer: Medicare Other | Admitting: Dermatology

## 2022-11-25 VITALS — BP 142/67 | HR 70

## 2022-11-25 DIAGNOSIS — Z1283 Encounter for screening for malignant neoplasm of skin: Secondary | ICD-10-CM | POA: Diagnosis not present

## 2022-11-25 DIAGNOSIS — L570 Actinic keratosis: Secondary | ICD-10-CM | POA: Diagnosis not present

## 2022-11-25 DIAGNOSIS — L578 Other skin changes due to chronic exposure to nonionizing radiation: Secondary | ICD-10-CM

## 2022-11-25 DIAGNOSIS — L814 Other melanin hyperpigmentation: Secondary | ICD-10-CM

## 2022-11-25 DIAGNOSIS — L821 Other seborrheic keratosis: Secondary | ICD-10-CM

## 2022-11-25 DIAGNOSIS — D229 Melanocytic nevi, unspecified: Secondary | ICD-10-CM

## 2022-11-25 DIAGNOSIS — L820 Inflamed seborrheic keratosis: Secondary | ICD-10-CM

## 2022-11-25 DIAGNOSIS — Z86018 Personal history of other benign neoplasm: Secondary | ICD-10-CM

## 2022-11-25 DIAGNOSIS — D225 Melanocytic nevi of trunk: Secondary | ICD-10-CM

## 2022-11-25 DIAGNOSIS — Z85828 Personal history of other malignant neoplasm of skin: Secondary | ICD-10-CM | POA: Diagnosis not present

## 2022-11-25 NOTE — Patient Instructions (Addendum)
Cryotherapy Aftercare  Wash gently with soap and water everyday.   Apply Vaseline and Band-Aid daily until healed.     Due to recent changes in healthcare laws, you may see results of your pathology and/or laboratory studies on MyChart before the doctors have had a chance to review them. We understand that in some cases there may be results that are confusing or concerning to you. Please understand that not all results are received at the same time and often the doctors may need to interpret multiple results in order to provide you with the best plan of care or course of treatment. Therefore, we ask that you please give us 2 business days to thoroughly review all your results before contacting the office for clarification. Should we see a critical lab result, you will be contacted sooner.   If You Need Anything After Your Visit  If you have any questions or concerns for your doctor, please call our main line at [phone] and press option 4 to reach your doctor's medical assistant. If no one answers, please leave a voicemail as directed and we will return your call as soon as possible. Messages left after 4 pm will be answered the following business day.   You may also send us a message via MyChart. We typically respond to MyChart messages within 1-2 business days.  For prescription refills, please ask your pharmacy to contact our office. Our fax number is [phone].  If you have an urgent issue when the clinic is closed that cannot wait until the next business day, you can page your doctor at the number below.    Please note that while we do our best to be available for urgent issues outside of office hours, we are not available 24/7.   If you have an urgent issue and are unable to reach us, you may choose to seek medical care at your doctor's office, retail clinic, urgent care center, or emergency room.  If you have a medical emergency, please immediately call 911 or go to the  emergency department.  Pager Numbers  - Dr. Kowalski: [phone]  - Dr. Moye: [phone]  - Dr. Stewart: [phone]  In the event of inclement weather, please call our main line at [phone] for an update on the status of any delays or closures.  Dermatology Medication Tips: Please keep the boxes that topical medications come in in order to help keep track of the instructions about where and how to use these. Pharmacies typically print the medication instructions only on the boxes and not directly on the medication tubes.   If your medication is too expensive, please contact our office at [phone] option 4 or send us a message through MyChart.   We are unable to tell what your co-pay for medications will be in advance as this is different depending on your insurance coverage. However, we may be able to find a substitute medication at lower cost or fill out paperwork to get insurance to cover a needed medication.   If a prior authorization is required to get your medication covered by your insurance company, please allow us 1-2 business days to complete this process.  Drug prices often vary depending on where the prescription is filled and some pharmacies may offer cheaper prices.  The website www.goodrx.com contains coupons for medications through different pharmacies. The prices here do not account for what the cost may be with help from insurance (it may be cheaper with your insurance), but the website can   give you the price if you did not use any insurance.  - You can print the associated coupon and take it with your prescription to the pharmacy.  - You may also stop by our office during regular business hours and pick up a GoodRx coupon card.  - If you need your prescription sent electronically to a different pharmacy, notify our office through [facility] MyChart or by phone at [phone] option 4.     Si Usted Necesita Algo Despus de Su Visita  Tambin puede  enviarnos un mensaje a travs de MyChart. Por lo general respondemos a los mensajes de MyChart en el transcurso de 1 a 2 das hbiles.  Para renovar recetas, por favor pida a su farmacia que se ponga en contacto con nuestra oficina. Nuestro nmero de fax es el [phone].  Si tiene un asunto urgente cuando la clnica est cerrada y que no puede esperar hasta el siguiente da hbil, puede llamar/localizar a su doctor(a) al nmero que aparece a continuacin.   Por favor, tenga en cuenta que aunque hacemos todo lo posible para estar disponibles para asuntos urgentes fuera del horario de oficina, no estamos disponibles las 24 horas del da, los 7 das de la semana.   Si tiene un problema urgente y no puede comunicarse con nosotros, puede optar por buscar atencin mdica  en el consultorio de su doctor(a), en una clnica privada, en un centro de atencin urgente o en una sala de emergencias.  Si tiene una emergencia mdica, por favor llame inmediatamente al 911 o vaya a la sala de emergencias.  Nmeros de bper  - Dr. Kowalski: [phone]  - Dra. Moye: [phone]  - Dra. Stewart: [phone]  En caso de inclemencias del tiempo, por favor llame a nuestra lnea principal al [phone] para una actualizacin sobre el estado de cualquier retraso o cierre.  Consejos para la medicacin en dermatologa: Por favor, guarde las cajas en las que vienen los medicamentos de uso tpico para ayudarle a seguir las instrucciones sobre dnde y cmo usarlos. Las farmacias generalmente imprimen las instrucciones del medicamento slo en las cajas y no directamente en los tubos del medicamento.   Si su medicamento es muy caro, por favor, pngase en contacto con nuestra oficina llamando al [phone] y presione la opcin 4 o envenos un mensaje a travs de MyChart.   No podemos decirle cul ser su copago por los medicamentos por adelantado ya que esto es diferente dependiendo de la cobertura de su seguro.  Sin embargo, es posible que podamos encontrar un medicamento sustituto a menor costo o llenar un formulario para que el seguro cubra el medicamento que se considera necesario.   Si se requiere una autorizacin previa para que su compaa de seguros cubra su medicamento, por favor permtanos de 1 a 2 das hbiles para completar este proceso.  Los precios de los medicamentos varan con frecuencia dependiendo del lugar de dnde se surte la receta y alguna farmacias pueden ofrecer precios ms baratos.  El sitio web www.goodrx.com tiene cupones para medicamentos de diferentes farmacias. Los precios aqu no tienen en cuenta lo que podra costar con la ayuda del seguro (puede ser ms barato con su seguro), pero el sitio web puede darle el precio si no utiliz ningn seguro.  - Puede imprimir el cupn correspondiente y llevarlo con su receta a la farmacia.  - Tambin puede pasar por nuestra oficina durante el horario de atencin regular y recoger una tarjeta de cupones de GoodRx.  -   Si necesita que su receta se enve electrnicamente a una farmacia diferente, informe a nuestra oficina a travs de MyChart de [facility] o por telfono llamando al [phone] y presione la opcin 4.  

## 2022-11-25 NOTE — Progress Notes (Signed)
Follow-Up Visit   Subjective  Cole Holt is a 76 y.o. male who presents for the following: Total body skin exam (Hx of BCC R post auricular, hx of Dysplastic Nevus R sup medial scapula). The patient presents for Total-Body Skin Exam (TBSE) for skin cancer screening and mole check.  The patient has spots, moles and lesions to be evaluated, some may be new or changing and the patient has concerns that these could be cancer.   The following portions of the chart were reviewed this encounter and updated as appropriate:   Tobacco  Allergies  Meds  Problems  Med Hx  Surg Hx  Fam Hx     Review of Systems:  No other skin or systemic complaints except as noted in HPI or Assessment and Plan.  Objective  Well appearing patient in no apparent distress; mood and affect are within normal limits.  A full examination was performed including scalp, head, eyes, ears, nose, lips, neck, chest, axillae, abdomen, back, buttocks, bilateral upper extremities, bilateral lower extremities, hands, feet, fingers, toes, fingernails, and toenails. All findings within normal limits unless otherwise noted below.  L forehead x 1 Stuck on waxy paps with erythema  L ear antihelix x 1 Pink scaly macules   Assessment & Plan   Lentigines - Scattered tan macules - Due to sun exposure - Benign-appearing, observe - Recommend daily broad spectrum sunscreen SPF 30+ to sun-exposed areas, reapply every 2 hours as needed. - Call for any changes - arms, back  Seborrheic Keratoses - Stuck-on, waxy, tan-brown papules and/or plaques  - Benign-appearing - Discussed benign etiology and prognosis. - Observe - Call for any changes - trunk, arms  Melanocytic Nevi - Tan-brown and/or pink-flesh-colored symmetric macules and papules - Benign appearing on exam today - Observation - Call clinic for new or changing moles - Recommend daily use of broad spectrum spf 30+ sunscreen to sun-exposed areas.  -  trunk  Hemangiomas - Red papules - Discussed benign nature - Observe - Call for any changes - trunk  Actinic Damage - Chronic condition, secondary to cumulative UV/sun exposure - diffuse scaly erythematous macules with underlying dyspigmentation - Recommend daily broad spectrum sunscreen SPF 30+ to sun-exposed areas, reapply every 2 hours as needed.  - Staying in the shade or wearing long sleeves, sun glasses (UVA+UVB protection) and wide brim hats (4-inch brim around the entire circumference of the hat) are also recommended for sun protection.  - Call for new or changing lesions.  Skin cancer screening performed today.   History of Basal Cell Carcinoma of the Skin - No evidence of recurrence today - Recommend regular full body skin exams - Recommend daily broad spectrum sunscreen SPF 30+ to sun-exposed areas, reapply every 2 hours as needed.  - Call if any new or changing lesions are noted between office visits  - R post auricular  History of Dysplastic Nevi - No evidence of recurrence today - Recommend regular full body skin exams - Recommend daily broad spectrum sunscreen SPF 30+ to sun-exposed areas, reapply every 2 hours as needed.  - Call if any new or changing lesions are noted between office visits  -R sup medial scapula  Inflamed seborrheic keratosis L forehead x 1 Symptomatic, irritating, patient would like treated. Destruction of lesion - L forehead x 1 Complexity: simple   Destruction method: cryotherapy   Informed consent: discussed and consent obtained   Timeout:  patient name, date of birth, surgical site, and procedure verified Lesion destroyed  using liquid nitrogen: Yes   Region frozen until ice ball extended beyond lesion: Yes   Outcome: patient tolerated procedure well with no complications   Post-procedure details: wound care instructions given    AK (actinic keratosis) L ear antihelix x 1 Destruction of lesion - L ear antihelix x 1 Complexity:  simple   Destruction method: cryotherapy   Informed consent: discussed and consent obtained   Timeout:  patient name, date of birth, surgical site, and procedure verified Lesion destroyed using liquid nitrogen: Yes   Region frozen until ice ball extended beyond lesion: Yes   Outcome: patient tolerated procedure well with no complications   Post-procedure details: wound care instructions given    Return in about 1 year (around 11/26/2023) for TBSE, Hx of BCC, Hx of Dysplastic nevi.  I, [name], RMA, am acting as scribe for Sarina Ser, MD . Documentation: I have reviewed the above documentation for accuracy and completeness, and I agree with the above.  Sarina Ser, MD

## 2022-12-06 ENCOUNTER — Encounter: Payer: Self-pay | Admitting: Dermatology

## 2023-02-17 ENCOUNTER — Other Ambulatory Visit: Payer: Self-pay | Admitting: Family Medicine

## 2023-02-17 ENCOUNTER — Telehealth: Payer: Self-pay | Admitting: Urology

## 2023-02-17 MED ORDER — TAMSULOSIN HCL 0.4 MG PO CAPS: 0.4000 mg | ORAL_CAPSULE | Freq: Every day | ORAL | 0 refills | Status: DC

## 2023-02-17 MED ORDER — TAMSULOSIN HCL 0.4 MG PO CAPS: 0.4000 mg | ORAL_CAPSULE | Freq: Every day | ORAL | 3 refills | Status: DC

## 2023-02-17 NOTE — Telephone Encounter (Signed)
Spoke to patient and refilled tamsulosin.

## 2023-02-17 NOTE — Telephone Encounter (Signed)
Pt called to let us know he only has 2 more pills of Tamsulosin left.  He needs a refill sent to [facility].  He said it usually takes a few weeks for him to receive and wants to know if we will send in a few weeks to [facility] in [place] to last til he gets his refill.

## 2023-03-01 ENCOUNTER — Other Ambulatory Visit: Payer: Self-pay | Admitting: Urology

## 2023-04-22 ENCOUNTER — Other Ambulatory Visit: Payer: Self-pay | Admitting: Urology

## 2023-04-22 DIAGNOSIS — N401 Enlarged prostate with lower urinary tract symptoms: Secondary | ICD-10-CM

## 2023-08-23 ENCOUNTER — Other Ambulatory Visit: Payer: Medicare Other

## 2023-08-23 DIAGNOSIS — N401 Enlarged prostate with lower urinary tract symptoms: Secondary | ICD-10-CM

## 2023-08-24 LAB — PSA: Prostate Specific Ag, Serum: 1 ng/mL (ref 0.0–4.0)

## 2023-08-25 NOTE — Progress Notes (Unsigned)
9:19 PM   Cole Holt 12-12-1946 [id number]  Referring provider: Marina Goodell, MD [address] [place],  [place] [address]  Urological history: 1. BPH with LU TS -tamsulosin 0.4 mg daily and finasteride 5 mg daily  2. ED -contributing factors of age, BPH, HTN, HLD, former smoker and anxiety -not interested in therapy  3. Prostate cancer screening -PSA (07/2023) 1.0 -no family history of prostate, breast or ovarian cancer  No chief complaint on file.  HPI: Cole Holt is a 77 y.o. male who presents today for yearly appointment.    Previous records reviewed.   I PSS***       Score:  1-7 Mild 8-19 Moderate 20-35 Severe    PMH: Past Medical History:  Diagnosis Date   Actinic keratosis    Acute cystitis with hematuria    Anxiety    Arthritis    Basal cell carcinoma 05/02/2019   right post auricular crease/excision   BPH (benign prostatic hyperplasia)    Diverticulitis 2012   Dysplastic nevus 05/06/2020   Right sup. medial scapula. Severe atypia, close to margin. Excised 06/25/2020, margins free.   Hypercholesterolemia    Hypertension    Urine retention     Surgical History: Past Surgical History:  Procedure Laterality Date   APPENDECTOMY  1960   COLONOSCOPY WITH PROPOFOL N/A 11/13/2016   Procedure: COLONOSCOPY WITH PROPOFOL;  Surgeon: Scot Jun, MD;  Location: [facility] ENDOSCOPY;  Service: Endoscopy;  Laterality: N/A;   ESOPHAGOGASTRODUODENOSCOPY (EGD) WITH PROPOFOL N/A 08/04/2022   Procedure: ESOPHAGOGASTRODUODENOSCOPY (EGD) WITH PROPOFOL;  Surgeon: Regis Bill, MD;  Location: [facility] ENDOSCOPY;  Service: Endoscopy;  Laterality: N/A;   ingiunal hernia repair  2016   1995, 1989   RHINOPLASTY  1996    Home Medications:  Allergies as of 08/26/2023   No Known Allergies      Medication List        Accurate as of August 25, 2023  9:19 PM. If you have any questions, ask your nurse or doctor.          aspirin 81 MG  chewable tablet Chew by mouth.   atorvastatin 10 MG tablet Commonly known as: LIPITOR Take 10 mg by mouth daily.   finasteride 5 MG tablet Commonly known as: PROSCAR TAKE 1 TABLET BY MOUTH DAILY   lisinopril-hydrochlorothiazide 10-12.5 MG tablet Commonly known as: ZESTORETIC   meloxicam 15 MG tablet Commonly known as: MOBIC Take 15 mg by mouth daily.   MULTIVITAMIN ADULT PO Take by mouth daily.   pantoprazole 20 MG tablet Commonly known as: PROTONIX Take 1 tablet (20 mg total) by mouth daily for 15 days.   tamsulosin 0.4 MG Caps capsule Commonly known as: FLOMAX Take 1 capsule (0.4 mg total) by mouth daily.   tamsulosin 0.4 MG Caps capsule Commonly known as: FLOMAX Take 1 capsule (0.4 mg total) by mouth daily.        Allergies: No Known Allergies  Family History: Family History  Problem Relation Age of Onset   Cancer Mother    Hypertension Mother    Lung cancer Father    Prostate cancer Neg Hx    Kidney cancer Neg Hx    Bladder Cancer Neg Hx     Social History:  reports that he has quit smoking. He has never used smokeless tobacco. He reports current alcohol use. He reports that he does not use drugs.  ROS: For pertinent review of systems please refer to history of  present illness  Physical Exam: There were no vitals taken for this visit.  Constitutional:  Well nourished. Alert and oriented, No acute distress. GU: No CVA tenderness.  No bladder fullness or masses.  Patient with circumcised/uncircumcised phallus. ***Foreskin easily retracted***  Urethral meatus is patent.  No penile discharge. No penile lesions or rashes. Scrotum without lesions, cysts, rashes and/or edema.  Testicles are located scrotally bilaterally. No masses are appreciated in the testicles. Left and right epididymis are normal. Rectal: Patient with  normal sphincter tone. Anus and perineum without scarring or rashes. No rectal masses are appreciated. Prostate is approximately *** grams,  *** nodules are appreciated. Seminal vesicles are normal.   Laboratory Data: Results for orders placed or performed in visit on 08/23/23  PSA  Result Value Ref Range   Prostate Specific Ag, Serum 1.0 0.0 - 4.0 ng/mL    Comprehensive Metabolic Panel (CMP) Order: [id number] Component Ref Range & Units 5 mo ago  Glucose 70 - 110 mg/dL 85  Sodium 621 - 308 mmol/L 137  Potassium 3.6 - 5.1 mmol/L 4.2  Chloride 97 - 109 mmol/L 100  Carbon Dioxide (CO2) 22.0 - 32.0 mmol/L 34.0 High   Urea Nitrogen (BUN) 7 - 25 mg/dL 11  Creatinine 0.7 - 1.3 mg/dL 0.8  Glomerular Filtration Rate (eGFR) >60 mL/min/1.73sq m 92  Comment: CKD-EPI (2021) does not include patient's race in the calculation of eGFR.  Monitoring changes of plasma creatinine and eGFR over time is useful for monitoring kidney function.  Interpretive Ranges for eGFR (CKD-EPI 2021):  eGFR:       >60 mL/min/1.73 sq. m - Normal eGFR:       30-59 mL/min/1.73 sq. m - Moderately Decreased eGFR:       15-29 mL/min/1.73 sq. m  - Severely Decreased eGFR:       < 15 mL/min/1.73 sq. m  - Kidney Failure   Note: These eGFR calculations do not apply in acute situations when eGFR is changing rapidly or patients on dialysis.  Calcium 8.7 - 10.3 mg/dL 9.5  AST 8 - 39 U/L 20  ALT 6 - 57 U/L 15  Alk Phos (alkaline Phosphatase) 34 - 104 U/L 56  Albumin 3.5 - 4.8 g/dL 4.5  Bilirubin, Total 0.3 - 1.2 mg/dL 0.8  Protein, Total 6.1 - 7.9 g/dL 6.7  A/G Ratio 1.0 - 5.0 gm/dL 2.0  Resulting Agency [facility]   Specimen Collected: 03/25/23 08:08   Performed by: [facility] Last Resulted: 03/25/23 12:16  Received From: [facility]  Result Received: 08/17/23 16:06   Lipid Panel w/calc LDL Order: [id number] Component Ref Range & Units 5 mo ago  Cholesterol, Total 100 - 200 mg/dL 952  Triglyceride 35 - 199 mg/dL 97  HDL (High Density Lipoprotein) Cholesterol 29.0 - 71.0 mg/dL 84.1  LDL  Calculated 0 - 130 mg/dL 72  VLDL Cholesterol mg/dL 19  Cholesterol/HDL Ratio 2.5  Resulting Agency [facility]   Specimen Collected: 03/25/23 08:08   Performed by: [facility] Last Resulted: 03/25/23 12:16  Received From: [facility]  Result Received: 08/17/23 16:06   Hemoglobin A1C Order: [id number] Component Ref Range & Units 5 mo ago  Hemoglobin A1C 4.2 - 5.6 % 5.3  Average Blood Glucose (Calc) mg/dL 253  Resulting Agency [facility]  Narrative Performed by [facility] Normal Range:    4.2 - 5.6% Increased  Risk:  5.7 - 6.4% Diabetes:        >= 6.5% Glycemic Control for adults with diabetes:  <7%    Specimen Collected: 03/25/23 08:08   Performed by: [facility] Last Resulted: 03/25/23 12:55  Received From: [facility]  Result Received: 08/17/23 16:06  I have reviewed the labs  Pertinent Imaging N/A  Assessment & Plan:    1. BPH with LUTS -PSA stable -continue conservative management, avoiding bladder irritants and timed voiding's -Continue tamsulosin 0.4 mg daily and finasteride 5 mg daily   No follow-ups on file.  Cloretta Ned   [facility] [address] [place], [place] [address] [phone]

## 2023-08-26 ENCOUNTER — Encounter: Payer: Self-pay | Admitting: Urology

## 2023-08-26 ENCOUNTER — Ambulatory Visit: Payer: Medicare Other | Admitting: Urology

## 2023-08-26 VITALS — BP 121/76 | HR 73 | Wt 162.6 lb

## 2023-08-26 DIAGNOSIS — N401 Enlarged prostate with lower urinary tract symptoms: Secondary | ICD-10-CM | POA: Diagnosis not present

## 2023-08-26 DIAGNOSIS — N138 Other obstructive and reflux uropathy: Secondary | ICD-10-CM

## 2023-08-26 MED ORDER — TAMSULOSIN HCL 0.4 MG PO CAPS: 0.4000 mg | ORAL_CAPSULE | Freq: Every day | ORAL | 3 refills | Status: DC

## 2023-08-26 MED ORDER — FINASTERIDE 5 MG PO TABS: 5.0000 mg | ORAL_TABLET | Freq: Every day | ORAL | 2 refills | Status: DC

## 2023-12-01 ENCOUNTER — Ambulatory Visit: Payer: Medicare Other | Admitting: Dermatology

## 2023-12-01 ENCOUNTER — Encounter: Payer: Self-pay | Admitting: Dermatology

## 2023-12-01 DIAGNOSIS — I8393 Asymptomatic varicose veins of bilateral lower extremities: Secondary | ICD-10-CM

## 2023-12-01 DIAGNOSIS — L814 Other melanin hyperpigmentation: Secondary | ICD-10-CM | POA: Diagnosis not present

## 2023-12-01 DIAGNOSIS — D692 Other nonthrombocytopenic purpura: Secondary | ICD-10-CM

## 2023-12-01 DIAGNOSIS — W908XXA Exposure to other nonionizing radiation, initial encounter: Secondary | ICD-10-CM

## 2023-12-01 DIAGNOSIS — I781 Nevus, non-neoplastic: Secondary | ICD-10-CM

## 2023-12-01 DIAGNOSIS — Z86018 Personal history of other benign neoplasm: Secondary | ICD-10-CM

## 2023-12-01 DIAGNOSIS — Z85828 Personal history of other malignant neoplasm of skin: Secondary | ICD-10-CM

## 2023-12-01 DIAGNOSIS — L821 Other seborrheic keratosis: Secondary | ICD-10-CM

## 2023-12-01 DIAGNOSIS — L578 Other skin changes due to chronic exposure to nonionizing radiation: Secondary | ICD-10-CM | POA: Diagnosis not present

## 2023-12-01 DIAGNOSIS — D229 Melanocytic nevi, unspecified: Secondary | ICD-10-CM

## 2023-12-01 DIAGNOSIS — L820 Inflamed seborrheic keratosis: Secondary | ICD-10-CM | POA: Diagnosis not present

## 2023-12-01 DIAGNOSIS — Z1283 Encounter for screening for malignant neoplasm of skin: Secondary | ICD-10-CM | POA: Diagnosis not present

## 2023-12-01 DIAGNOSIS — D1801 Hemangioma of skin and subcutaneous tissue: Secondary | ICD-10-CM

## 2023-12-01 NOTE — Patient Instructions (Addendum)

## 2023-12-01 NOTE — Progress Notes (Signed)
Follow-Up Visit   Subjective  Cole Holt is a 77 y.o. male who presents for the following: Skin Cancer Screening and Full Body Skin Exam. Hx of BCC. Hx of DN with severe atypia.   The patient presents for Total-Body Skin Exam (TBSE) for skin cancer screening and mole check. The patient has spots, moles and lesions to be evaluated, some may be new or changing and the patient may have concern these could be cancer.    The following portions of the chart were reviewed this encounter and updated as appropriate: medications, allergies, medical history  Review of Systems:  No other skin or systemic complaints except as noted in HPI or Assessment and Plan.  Objective  Well appearing patient in no apparent distress; mood and affect are within normal limits.  A full examination was performed including scalp, head, eyes, ears, nose, lips, neck, chest, axillae, abdomen, back, buttocks, bilateral upper extremities, bilateral lower extremities, hands, feet, fingers, toes, fingernails, and toenails. All findings within normal limits unless otherwise noted below.   Relevant physical exam findings are noted in the Assessment and Plan.  R vertex Scalp x1, R cheek x1, R forearm x1 (3) Erythematous keratotic or waxy stuck-on papule or plaque.    Assessment & Plan   HISTORY OF BASAL CELL CARCINOMA OF THE SKIN. Right post auricular crease/excision. 05/02/2019. - No evidence of recurrence today - Recommend regular full body skin exams - Recommend daily broad spectrum sunscreen SPF 30+ to sun-exposed areas, reapply every 2 hours as needed.  - Call if any new or changing lesions are noted between office visits   HISTORY OF DYSPLASTIC NEVUS. Right sup. medial scapula. Severe atypia, close to margin. Excised 06/25/2020, margins free.  No evidence of recurrence today Recommend regular full body skin exams Recommend daily broad spectrum sunscreen SPF 30+ to sun-exposed areas, reapply every 2 hours as  needed.  Call if any new or changing lesions are noted between office visits   SKIN CANCER SCREENING PERFORMED TODAY.  ACTINIC DAMAGE - Chronic condition, secondary to cumulative UV/sun exposure - diffuse scaly erythematous macules with underlying dyspigmentation - Recommend daily broad spectrum sunscreen SPF 30+ to sun-exposed areas, reapply every 2 hours as needed.  - Staying in the shade or wearing long sleeves, sun glasses (UVA+UVB protection) and wide brim hats (4-inch brim around the entire circumference of the hat) are also recommended for sun protection.  - Call for new or changing lesions.  LENTIGINES, SEBORRHEIC KERATOSES, HEMANGIOMAS - Benign normal skin lesions - Benign-appearing - Call for any changes  MELANOCYTIC NEVI - Tan-brown and/or pink-flesh-colored symmetric macules and papules - Benign appearing on exam today - Observation - Call clinic for new or changing moles - Recommend daily use of broad spectrum spf 30+ sunscreen to sun-exposed areas.   Purpura - Chronic; persistent and recurrent.  Treatable, but not curable. - Violaceous macules and patches - Benign - Related to trauma, age, sun damage and/or use of blood thinners, chronic use of topical and/or oral steroids - Observe - Can use OTC arnica containing moisturizer such as Dermend Bruise Formula if desired - Call for worsening or other concerns  Varicose Veins/Spider Veins - Dilated blue, purple or red veins at the lower extremities - Reassured - Smaller vessels can be treated by sclerotherapy (a procedure to inject a medicine into the veins to make them disappear) if desired, but the treatment is not covered by insurance. Larger vessels may be covered if symptomatic and we would refer  to vascular surgeon if treatment desired.     Inflamed seborrheic keratosis (3) R vertex Scalp x1, R cheek x1, R forearm x1  Symptomatic, irritating, patient would like treated.  Destruction of lesion - R vertex  Scalp x1, R cheek x1, R forearm x1 (3) Complexity: simple   Destruction method: cryotherapy   Informed consent: discussed and consent obtained   Timeout:  patient name, date of birth, surgical site, and procedure verified Lesion destroyed using liquid nitrogen: Yes   Region frozen until ice ball extended beyond lesion: Yes   Outcome: patient tolerated procedure well with no complications   Post-procedure details: wound care instructions given   Additional details:  Prior to procedure, discussed risks of blister formation, small wound, skin dyspigmentation, or rare scar following cryotherapy. Recommend Vaseline ointment to treated areas while healing.    Return in about 1 year (around 11/30/2024) for TBSE, HxBCC, HxDN.  I, Lawson Radar, CMA, am acting as scribe for Armida Sans, MD.   Documentation: I have reviewed the above documentation for accuracy and completeness, and I agree with the above.  Armida Sans, MD

## 2024-05-03 ENCOUNTER — Other Ambulatory Visit: Payer: Self-pay | Admitting: Urology

## 2024-07-08 ENCOUNTER — Other Ambulatory Visit: Payer: Self-pay | Admitting: Urology

## 2024-07-08 DIAGNOSIS — N138 Other obstructive and reflux uropathy: Secondary | ICD-10-CM

## 2024-08-16 ENCOUNTER — Other Ambulatory Visit: Payer: Self-pay

## 2024-08-16 DIAGNOSIS — R972 Elevated prostate specific antigen [PSA]: Secondary | ICD-10-CM

## 2024-08-17 ENCOUNTER — Other Ambulatory Visit: Payer: Self-pay

## 2024-08-17 DIAGNOSIS — R972 Elevated prostate specific antigen [PSA]: Secondary | ICD-10-CM

## 2024-08-18 LAB — PSA: Prostate Specific Ag, Serum: 0.9 ng/mL (ref 0.0–4.0)

## 2024-08-24 NOTE — Progress Notes (Signed)
 8:53 PM   Cole Holt [birth year] [id number]  Referring provider: Jeffie Cheryl BRAVO, MD [address] [place],  [place] [address]  Urological history: 1. BPH with LU TS -tamsulosin  0.4 mg daily and finasteride  5 mg daily  2. ED -contributing factors of age, BPH, HTN, HLD, former smoker and anxiety -not interested in therapy  3. Prostate cancer screening -PSA (07/2024) 0.9 (1.8)  -no family history of prostate, breast or ovarian cancer  Chief Complaint  Patient presents with   Follow-up   HPI: Cole Holt is a 78 y.o. man who presents today for yearly appointment.    Previous records reviewed.   He has nocturia x 2-3, but it is not bothersome to him.  Patient denies any modifying or aggravating factors.  Patient denies any recent UTI's, gross hematuria, dysuria or suprapubic/flank pain.  Patient denies any fevers, chills, nausea or vomiting.    He is taking tamsulosin  0.4 mg daily and finasteride  5 mg daily.  Serum creatinine 0.8 with a EGFR of 92 in April.  Hemoglobin A1c of 5.4 in April.  Recent PSA was 0.9.  PMH: Past Medical History:  Diagnosis Date   Actinic keratosis    Acute cystitis with hematuria    Anxiety    Arthritis    Basal cell carcinoma 05/02/2019   right post auricular crease/excision   BPH (benign prostatic hyperplasia)    Diverticulitis 2012   Dysplastic nevus 05/06/2020   Right sup. medial scapula. Severe atypia, close to margin. Excised 06/25/2020, margins free.   Hypercholesterolemia    Hypertension    Urine retention     Surgical History: Past Surgical History:  Procedure Laterality Date   APPENDECTOMY  1960   COLONOSCOPY WITH PROPOFOL  N/A 11/13/2016   Procedure: COLONOSCOPY WITH PROPOFOL ;  Surgeon: Lamar ONEIDA Holmes, MD;  [facility] ENDOSCOPY;  Service: Endoscopy;  Laterality: N/A;   ESOPHAGOGASTRODUODENOSCOPY (EGD) WITH PROPOFOL  N/A 08/04/2022   Procedure: ESOPHAGOGASTRODUODENOSCOPY (EGD) WITH PROPOFOL ;  Surgeon:  Maryruth Ole ONEIDA, MD;  Location: [facility] ENDOSCOPY;  Service: Endoscopy;  Laterality: N/A;   ingiunal hernia repair  2016   1995, 1989   RHINOPLASTY  1996    Home Medications:  Allergies as of 08/25/2024   No Known Allergies      Medication List        Accurate as of August 25, 2024 11:59 PM. If you have any questions, ask your nurse or doctor.          aspirin 81 MG chewable tablet Chew by mouth.   atorvastatin 10 MG tablet Commonly known as: LIPITOR Take 10 mg by mouth daily.   finasteride  5 MG tablet Commonly known as: PROSCAR  TAKE 1 TABLET BY MOUTH DAILY   lisinopril-hydrochlorothiazide 10-12.5 MG tablet Commonly known as: ZESTORETIC   meloxicam 15 MG tablet Commonly known as: MOBIC Take 15 mg by mouth daily.   MULTIVITAMIN ADULT PO Take by mouth daily.   pantoprazole  20 MG tablet Commonly known as: PROTONIX  Take 1 tablet (20 mg total) by mouth daily for 15 days.   tamsulosin  0.4 MG Caps capsule Commonly known as: FLOMAX  TAKE 1 CAPSULE BY MOUTH DAILY        Allergies: No Known Allergies  Family History: Family History  Problem Relation Age of Onset   Cancer Mother    Hypertension Mother    Lung cancer Father    Prostate cancer Neg Hx    Kidney cancer Neg Hx    Bladder Cancer Neg Hx  Social History:  reports that he has quit smoking. He has never used smokeless tobacco. He reports current alcohol use. He reports that he does not use drugs.  ROS: For pertinent review of systems please refer to history of present illness  Physical Exam: BP 119/61 (BP Location: Left Arm, Patient Position: Sitting, Cuff Size: Normal)   Pulse 65   Ht 5' 8 (1.727 m)   Wt 162 lb (73.5 kg)   SpO2 99%   BMI 24.63 kg/m   Constitutional:  Well nourished. Alert and oriented, No acute distress. HEENT: [place] AT, moist mucus membranes.  Trachea midline Cardiovascular: No clubbing, cyanosis, or edema. Respiratory: Normal respiratory effort, no increased work of  breathing. Neurologic: Grossly intact, no focal deficits, moving all 4 extremities. Psychiatric: Normal mood and affect.   Laboratory Data: See HPI and EPIC I have reviewed the labs  Pertinent Imaging N/A  Assessment & Plan:    1. BPH with LUTS -PSA stable -continue conservative management, avoiding bladder irritants and timed voiding's -Continue tamsulosin  0.4 mg daily and finasteride  5 mg daily   Return in about 1 year (around 08/25/2025) for PSA and medication management .  CLOTILDA HELON RIGGERS   [facility] [address] [place], [place] [address] [phone]

## 2024-08-25 ENCOUNTER — Ambulatory Visit: Payer: Self-pay | Admitting: Urology

## 2024-08-25 VITALS — BP 119/61 | HR 65 | Ht 68.0 in | Wt 162.0 lb

## 2024-08-25 DIAGNOSIS — N401 Enlarged prostate with lower urinary tract symptoms: Secondary | ICD-10-CM | POA: Diagnosis not present

## 2024-08-25 DIAGNOSIS — N138 Other obstructive and reflux uropathy: Secondary | ICD-10-CM

## 2024-08-28 ENCOUNTER — Encounter: Payer: Self-pay | Admitting: Urology

## 2024-11-30 ENCOUNTER — Ambulatory Visit: Payer: Medicare Other | Admitting: Dermatology

## 2024-12-04 ENCOUNTER — Ambulatory Visit: Admitting: Dermatology

## 2024-12-04 DIAGNOSIS — Z86018 Personal history of other benign neoplasm: Secondary | ICD-10-CM

## 2024-12-04 DIAGNOSIS — Z85828 Personal history of other malignant neoplasm of skin: Secondary | ICD-10-CM

## 2024-12-04 DIAGNOSIS — L570 Actinic keratosis: Secondary | ICD-10-CM

## 2024-12-04 DIAGNOSIS — L578 Other skin changes due to chronic exposure to nonionizing radiation: Secondary | ICD-10-CM

## 2024-12-04 DIAGNOSIS — Z1283 Encounter for screening for malignant neoplasm of skin: Secondary | ICD-10-CM

## 2024-12-04 DIAGNOSIS — L814 Other melanin hyperpigmentation: Secondary | ICD-10-CM

## 2024-12-04 DIAGNOSIS — L820 Inflamed seborrheic keratosis: Secondary | ICD-10-CM

## 2024-12-04 DIAGNOSIS — D229 Melanocytic nevi, unspecified: Secondary | ICD-10-CM

## 2024-12-04 NOTE — Patient Instructions (Addendum)
 Actinic keratoses are precancerous spots that appear secondary to cumulative UV radiation exposure/sun exposure over time. They are chronic with expected duration over 1 year. A portion of actinic keratoses will progress to squamous cell carcinoma of the skin. It is not possible to reliably predict which spots will progress to skin cancer and so treatment is recommended to prevent development of skin cancer.  Recommend daily broad spectrum sunscreen SPF 30+ to sun-exposed areas, reapply every 2 hours as needed.  Recommend staying in the shade or wearing long sleeves, sun glasses (UVA+UVB protection) and wide brim hats (4-inch brim around the entire circumference of the hat). Call for new or changing lesions.   Cryotherapy Aftercare  Wash gently with soap and water everyday.   Apply Vaseline and Band-Aid daily until healed.   Seborrheic Keratosis  What causes seborrheic keratoses? Seborrheic keratoses are harmless, common skin growths that first appear during adult life.  As time goes by, more growths appear.  Some people may develop a large number of them.  Seborrheic keratoses appear on both covered and uncovered body parts.  They are not caused by sunlight.  The tendency to develop seborrheic keratoses can be inherited.  They vary in color from skin-colored to gray, brown, or even black.  They can be either smooth or have a rough, warty surface.   Seborrheic keratoses are superficial and look as if they were stuck on the skin.  Under the microscope this type of keratosis looks like layers upon layers of skin.  That is why at times the top layer may seem to fall off, but the rest of the growth remains and re-grows.    Treatment Seborrheic keratoses do not need to be treated, but can easily be removed in the office.  Seborrheic keratoses often cause symptoms when they rub on clothing or jewelry.  Lesions can be in the way of shaving.  If they become inflamed, they can cause itching, soreness, or  burning.  Removal of a seborrheic keratosis can be accomplished by freezing, burning, or surgery. If any spot bleeds, scabs, or grows rapidly, please return to have it checked, as these can be an indication of a skin cancer.    Gentle Skin Care Guide  1. Bathe no more than once a day.  2. Avoid bathing in hot water  3. Use a mild soap like Dove, Vanicream, Cetaphil, CeraVe. Can use Lever 2000 or Cetaphil antibacterial soap  4. Use soap only where you need it. On most days, use it under your arms, between your legs, and on your feet. Let the water rinse other areas unless visibly dirty.  5. When you get out of the bath/shower, use a towel to gently blot your skin dry, don't rub it.  6. While your skin is still a little damp, apply a moisturizing cream such as Vanicream, CeraVe, Cetaphil, Eucerin, Sarna lotion or plain Vaseline Jelly. For hands apply Neutrogena Norwegian Hand Cream or Excipial Hand Cream.  7. Reapply moisturizer any time you start to itch or feel dry.  8. Sometimes using free and clear laundry detergents can be helpful. Fabric softener sheets should be avoided. Downy Free & Gentle liquid, or any liquid fabric softener that is free of dyes and perfumes, it acceptable to use  9. If your doctor has given you prescription creams you may apply moisturizers over them       Melanoma ABCDEs  Melanoma is the most dangerous type of skin cancer, and is the leading cause of  death from skin disease.  You are more likely to develop melanoma if you: Have light-colored skin, light-colored eyes, or red or blond hair Spend a lot of time in the sun Tan regularly, either outdoors or in a tanning bed Have had blistering sunburns, especially during childhood Have a close family member who has had a melanoma Have atypical moles or large birthmarks  Early detection of melanoma is key since treatment is typically straightforward and cure rates are extremely high if we catch it early.    The first sign of melanoma is often a change in a mole or a new dark spot.  The ABCDE system is a way of remembering the signs of melanoma.  A for asymmetry:  The two halves do not match. B for border:  The edges of the growth are irregular. C for color:  A mixture of colors are present instead of an even brown color. D for diameter:  Melanomas are usually (but not always) greater than 6mm - the size of a pencil eraser. E for evolution:  The spot keeps changing in size, shape, and color.  Please check your skin once per month between visits. You can use a small mirror in front and a large mirror behind you to keep an eye on the back side or your body.   If you see any new or changing lesions before your next follow-up, please call to schedule a visit.  Please continue daily skin protection including broad spectrum sunscreen SPF 30+ to sun-exposed areas, reapplying every 2 hours as needed when you're outdoors.   Staying in the shade or wearing long sleeves, sun glasses (UVA+UVB protection) and wide brim hats (4-inch brim around the entire circumference of the hat) are also recommended for sun protection.    Due to recent changes in healthcare laws, you may see results of your pathology and/or laboratory studies on MyChart before the doctors have had a chance to review them. We understand that in some cases there may be results that are confusing or concerning to you. Please understand that not all results are received at the same time and often the doctors may need to interpret multiple results in order to provide you with the best plan of care or course of treatment. Therefore, we ask that you please give us  2 business days to thoroughly review all your results before contacting the office for clarification. Should we see a critical lab result, you will be contacted sooner.   If You Need Anything After Your Visit  If you have any questions or concerns for your doctor, please call our main  line at [phone] and press option 4 to reach your doctor's medical assistant. If no one answers, please leave a voicemail as directed and we will return your call as soon as possible. Messages left after 4 pm will be answered the following business day.   You may also send us  a message via MyChart. We typically respond to MyChart messages within 1-2 business days.  For prescription refills, please ask your pharmacy to contact our office. Our fax number is [phone].  If you have an urgent issue when the clinic is closed that cannot wait until the next business day, you can page your doctor at the number below.    Please note that while we do our best to be available for urgent issues outside of office hours, we are not available 24/7.   If you have an urgent issue and are unable to reach  us , you may choose to seek medical care at your doctor's office, retail clinic, urgent care center, or emergency room.  If you have a medical emergency, please immediately call 911 or go to the emergency department.  Pager Numbers  - Dr. Hester: [phone]  - Dr. Jackquline: [phone]  - Dr. Claudene: [phone]   - Dr. Raymund: [phone]  In the event of inclement weather, please call our main line at [phone] for an update on the status of any delays or closures.  Dermatology Medication Tips: Please keep the boxes that topical medications come in in order to help keep track of the instructions about where and how to use these. Pharmacies typically print the medication instructions only on the boxes and not directly on the medication tubes.   If your medication is too expensive, please contact our office at [phone] option 4 or send us  a message through MyChart.   We are unable to tell what your co-pay for medications will be in advance as this is different depending on your insurance coverage. However, we may be able to find a substitute medication at lower cost or fill out  paperwork to get insurance to cover a needed medication.   If a prior authorization is required to get your medication covered by your insurance company, please allow us  1-2 business days to complete this process.  Drug prices often vary depending on where the prescription is filled and some pharmacies may offer cheaper prices.  The website www.goodrx.com contains coupons for medications through different pharmacies. The prices here do not account for what the cost may be with help from insurance (it may be cheaper with your insurance), but the website can give you the price if you did not use any insurance.  - You can print the associated coupon and take it with your prescription to the pharmacy.  - You may also stop by our office during regular business hours and pick up a GoodRx coupon card.  - If you need your prescription sent electronically to a different pharmacy, notify our office through [facility] or by phone at [phone] option 4.     Si Usted Necesita Algo Despus de Su Visita  Tambin puede enviarnos un mensaje a travs de Clinical Cytogeneticist. Por lo general respondemos a los mensajes de MyChart en el transcurso de 1 a 2 das hbiles.  Para renovar recetas, por favor pida a su farmacia que se ponga en contacto con nuestra oficina. Randi lakes de fax es [place] [phone].  Si tiene un asunto urgente cuando la clnica est cerrada y que no puede esperar hasta el siguiente da hbil, puede llamar/localizar a su doctor(a) al nmero que aparece a continuacin.   Por favor, tenga en cuenta que aunque hacemos todo lo posible para estar disponibles para asuntos urgentes fuera del horario de [place], no estamos disponibles las 24 horas del da, los 7 [address]  [address] de la [place].   Si tiene un problema urgente y no puede comunicarse con nosotros, puede optar por buscar atencin mdica  en el consultorio de su doctor(a), en una clnica privada, en un centro de atencin urgente o en una sala de  emergencias.  Si tiene engineer, drilling, por favor llame inmediatamente al 911 o vaya a la sala de emergencias.  Nmeros de bper  - Dr. Hester: [phone]  - Dra. Jackquline: [phone]  - Dr. Claudene: [phone]  - Dra. Kitts: [phone]  En caso de inclemencias del tiempo, por favor llame a nuestra lnea principal  al [phone] para una actualizacin sobre el [place] de cualquier retraso o cierre.  Consejos para la medicacin en dermatologa: Por favor, guarde las cajas en las que vienen los medicamentos de uso tpico para ayudarle a seguir las instrucciones sobre dnde y cmo usarlos. Las farmacias generalmente imprimen las instrucciones del medicamento slo en las cajas y no directamente en los tubos del [place].   Si su medicamento es muy caro, por favor, pngase en contacto con landry rieger llamando al [phone] y presione la opcin 4 o envenos un mensaje a travs de Clinical Cytogeneticist.   No podemos decirle cul ser su copago por los medicamentos por adelantado ya que esto es diferente dependiendo de la cobertura de su seguro. Sin embargo, es posible que podamos encontrar un medicamento sustituto a audiological scientist un formulario para que el seguro cubra el medicamento que se considera necesario.   Si se requiere una autorizacin previa para que su compaa de seguros cubra su medicamento, por favor permtanos de 1 a 2 das hbiles para completar este proceso.  Los precios de los medicamentos varan con frecuencia dependiendo del environmental consultant de dnde se surte la receta y alguna farmacias pueden ofrecer precios ms baratos.  El sitio web www.goodrx.com tiene cupones para medicamentos de health and safety inspector. Los precios aqu no tienen en cuenta lo que podra costar con la ayuda del seguro (puede ser ms barato con su seguro), pero el sitio web puede darle el precio si no utiliz tourist information centre manager.  - Puede imprimir el cupn correspondiente y llevarlo con su receta a la  farmacia.  - Tambin puede pasar por nuestra oficina durante el horario de atencin regular y education officer, museum una tarjeta de cupones de GoodRx.  - Si necesita que su receta se enve electrnicamente a una farmacia diferente, informe a nuestra oficina a travs de MyChart de [place] o por telfono llamando al [phone] y presione la opcin 4.

## 2024-12-04 NOTE — Progress Notes (Unsigned)
 Follow-Up Visit   Subjective  Cole Holt is a 78 y.o. male who presents for the following: Skin Cancer Screening and Full Body Skin Exam Hx of bcc Hx of dysplastic nevis , hx of aks, hx of isks  The patient presents for Total-Body Skin Exam (TBSE) for skin cancer screening and mole check. The patient has spots, moles and lesions to be evaluated, some may be new or changing and the patient may have concern these could be cancer.  The following portions of the chart were reviewed this encounter and updated as appropriate: medications, allergies, medical history  Review of Systems:  No other skin or systemic complaints except as noted in HPI or Assessment and Plan.  Objective  Well appearing patient in no apparent distress; mood and affect are within normal limits.  A full examination was performed including scalp, head, eyes, ears, nose, lips, neck, chest, axillae, abdomen, back, buttocks, bilateral upper extremities, bilateral lower extremities, hands, feet, fingers, toes, fingernails, and toenails. All findings within normal limits unless otherwise noted below.   Relevant physical exam findings are noted in the Assessment and Plan.  b/l ear x 3 (3) Erythematous thin papules/macules with gritty scale.  right distal dorsum forearm x 1 Erythematous stuck-on, waxy papule or plaque  Assessment & Plan    HISTORY OF BASAL CELL CARCINOMA OF THE  SKIN. 05/02/2019 Right post auricular crease/excision. 05/02/2019. - No evidence of recurrence today - Recommend regular full body skin exams - Recommend daily broad spectrum sunscreen SPF 30+ to sun-exposed areas, reapply every 2 hours as needed.  - Call if any new or changing lesions are noted between office visits    HISTORY OF DYSPLASTIC NEVUS.  05/06/2020 Right sup. medial scapula. Severe atypia, close to margin. Excised 06/25/2020, margins free.  No evidence of recurrence today Recommend regular full body skin exams Recommend daily  broad spectrum sunscreen SPF 30+ to sun-exposed areas, reapply every 2 hours as needed.  Call if any new or changing lesions are noted between office visits    SKIN CANCER SCREENING PERFORMED TODAY.  ACTINIC DAMAGE - Chronic condition, secondary to cumulative UV/sun exposure - diffuse scaly erythematous macules with underlying dyspigmentation - Recommend daily broad spectrum sunscreen SPF 30+ to sun-exposed areas, reapply every 2 hours as needed.  - Staying in the shade or wearing long sleeves, sun glasses (UVA+UVB protection) and wide brim hats (4-inch brim around the entire circumference of the hat) are also recommended for sun protection.  - Call for new or changing lesions.  LENTIGINES, SEBORRHEIC KERATOSES, HEMANGIOMAS - Benign normal skin lesions - Benign-appearing - Call for any changes  MELANOCYTIC NEVI - Tan-brown and/or pink-flesh-colored symmetric macules and papules - Benign appearing on exam today - Observation - Call clinic for new or changing moles - Recommend daily use of broad spectrum spf 30+ sunscreen to sun-exposed areas.    Acrochordons (Skin Tags) At groin - Fleshy, skin-colored pedunculated papules - Benign appearing.  - Observe. - If desired, they can be removed with an in office procedure that is not covered by insurance. - Please call the clinic if you notice any new or changing lesions.  Xerosis - diffuse xerotic patches - recommend gentle, hydrating skin care - gentle skin care handout given   ACTINIC KERATOSIS (3) b/l ear x 3 (3) Actinic keratoses are precancerous spots that appear secondary to cumulative UV radiation exposure/sun exposure over time. They are chronic with expected duration over 1 year. A portion of actinic keratoses will  progress to squamous cell carcinoma of the skin. It is not possible to reliably predict which spots will progress to skin cancer and so treatment is recommended to prevent development of skin cancer.  Recommend  daily broad spectrum sunscreen SPF 30+ to sun-exposed areas, reapply every 2 hours as needed.  Recommend staying in the shade or wearing long sleeves, sun glasses (UVA+UVB protection) and wide brim hats (4-inch brim around the entire circumference of the hat). Call for new or changing lesions. Destruction of lesion - b/l ear x 3 (3) Complexity: simple   Destruction method: cryotherapy   Informed consent: discussed and consent obtained   Timeout:  patient name, date of birth, surgical site, and procedure verified Lesion destroyed using liquid nitrogen: Yes   Region frozen until ice ball extended beyond lesion: Yes   Outcome: patient tolerated procedure well with no complications   Post-procedure details: wound care instructions given    INFLAMED SEBORRHEIC KERATOSIS right distal dorsum forearm x 1 Symptomatic, irritating, patient would like treated. Destruction of lesion - right distal dorsum forearm x 1 Complexity: simple   Destruction method: cryotherapy   Informed consent: discussed and consent obtained   Timeout:  patient name, date of birth, surgical site, and procedure verified Lesion destroyed using liquid nitrogen: Yes   Region frozen until ice ball extended beyond lesion: Yes   Outcome: patient tolerated procedure well with no complications   Post-procedure details: wound care instructions given    Return in about 1 year (around 12/04/2025) for TBSE.  IEleanor Blush, CMA, am acting as scribe for Alm Rhyme, MD.   Documentation: I have reviewed the above documentation for accuracy and completeness, and I agree with the above.  Alm Rhyme, MD

## 2024-12-05 ENCOUNTER — Encounter: Payer: Self-pay | Admitting: Dermatology

## 2025-01-17 ENCOUNTER — Other Ambulatory Visit: Payer: Self-pay | Admitting: Urology

## 2025-08-17 ENCOUNTER — Other Ambulatory Visit

## 2025-08-24 ENCOUNTER — Ambulatory Visit: Admitting: Urology

## 2025-12-04 ENCOUNTER — Ambulatory Visit: Admitting: Dermatology
